# Patient Record
Sex: Female | Born: 1940 | Race: White | Hispanic: No | Smoking: Never smoker
Health system: Southern US, Community
[De-identification: ages and names within clinical notes are randomized; demographics above are authoritative.]

## PROBLEM LIST (undated history)

## (undated) DIAGNOSIS — C801 Malignant (primary) neoplasm, unspecified: Secondary | ICD-10-CM

## (undated) DIAGNOSIS — Z9889 Other specified postprocedural states: Secondary | ICD-10-CM

## (undated) DIAGNOSIS — M6281 Muscle weakness (generalized): Secondary | ICD-10-CM

## (undated) DIAGNOSIS — G8929 Other chronic pain: Secondary | ICD-10-CM

## (undated) DIAGNOSIS — M549 Dorsalgia, unspecified: Secondary | ICD-10-CM

## (undated) DIAGNOSIS — R112 Nausea with vomiting, unspecified: Secondary | ICD-10-CM

## (undated) DIAGNOSIS — G9341 Metabolic encephalopathy: Secondary | ICD-10-CM

## (undated) DIAGNOSIS — N390 Urinary tract infection, site not specified: Secondary | ICD-10-CM

## (undated) DIAGNOSIS — F419 Anxiety disorder, unspecified: Secondary | ICD-10-CM

## (undated) DIAGNOSIS — N209 Urinary calculus, unspecified: Secondary | ICD-10-CM

## (undated) DIAGNOSIS — F32A Depression, unspecified: Secondary | ICD-10-CM

## (undated) DIAGNOSIS — F329 Major depressive disorder, single episode, unspecified: Secondary | ICD-10-CM

## (undated) DIAGNOSIS — I10 Essential (primary) hypertension: Secondary | ICD-10-CM

## (undated) DIAGNOSIS — G4730 Sleep apnea, unspecified: Secondary | ICD-10-CM

## (undated) DIAGNOSIS — E785 Hyperlipidemia, unspecified: Secondary | ICD-10-CM

## (undated) DIAGNOSIS — N189 Chronic kidney disease, unspecified: Secondary | ICD-10-CM

## (undated) DIAGNOSIS — G2581 Restless legs syndrome: Secondary | ICD-10-CM

## (undated) DIAGNOSIS — H409 Unspecified glaucoma: Secondary | ICD-10-CM

## (undated) DIAGNOSIS — I209 Angina pectoris, unspecified: Secondary | ICD-10-CM

## (undated) DIAGNOSIS — M1990 Unspecified osteoarthritis, unspecified site: Secondary | ICD-10-CM

## (undated) DIAGNOSIS — J189 Pneumonia, unspecified organism: Secondary | ICD-10-CM

## (undated) DIAGNOSIS — K5900 Constipation, unspecified: Secondary | ICD-10-CM

## (undated) DIAGNOSIS — F319 Bipolar disorder, unspecified: Secondary | ICD-10-CM

## (undated) DIAGNOSIS — H9190 Unspecified hearing loss, unspecified ear: Secondary | ICD-10-CM

## (undated) DIAGNOSIS — Z972 Presence of dental prosthetic device (complete) (partial): Secondary | ICD-10-CM

## (undated) DIAGNOSIS — D759 Disease of blood and blood-forming organs, unspecified: Secondary | ICD-10-CM

## (undated) DIAGNOSIS — R569 Unspecified convulsions: Secondary | ICD-10-CM

## (undated) HISTORY — PX: CARPAL TUNNEL RELEASE: SHX101

## (undated) HISTORY — PX: BACK SURGERY: SHX140

## (undated) HISTORY — PX: CYSTOSCOPY W/ URETEROSCOPY W/ LITHOTRIPSY: SUR380

## (undated) HISTORY — PX: KNEE ARTHROSCOPY: SUR90

## (undated) HISTORY — DX: Unspecified glaucoma: H40.9

---

## 1973-02-15 HISTORY — PX: CHOLECYSTECTOMY: SHX55

## 1985-02-15 HISTORY — PX: ABDOMINAL HYSTERECTOMY: SHX81

## 1986-02-15 HISTORY — PX: UVULOPALATOPLASTY: SHX2633

## 1994-02-15 HISTORY — PX: OVARY SURGERY: SHX727

## 1994-02-15 HISTORY — PX: PORTACATH PLACEMENT: SHX2246

## 1997-02-15 HISTORY — PX: LAMINECTOMY LUMBAR SPLINE W/ PLACEMENT SPINAL CORD STIMULATOR: SHX1916

## 2001-02-15 HISTORY — PX: PORT-A-CATH REMOVAL: SHX5289

## 2002-02-15 DIAGNOSIS — I209 Angina pectoris, unspecified: Secondary | ICD-10-CM

## 2002-02-15 HISTORY — PX: FILTERING PROCEDURE: SHX5296

## 2002-02-15 HISTORY — DX: Angina pectoris, unspecified: I20.9

## 2003-02-16 HISTORY — PX: JOINT REPLACEMENT: SHX530

## 2011-08-03 ENCOUNTER — Encounter (HOSPITAL_COMMUNITY): Payer: Self-pay | Admitting: Respiratory Therapy

## 2011-08-06 ENCOUNTER — Encounter (HOSPITAL_COMMUNITY): Payer: Self-pay

## 2011-08-06 ENCOUNTER — Encounter (HOSPITAL_COMMUNITY)
Admission: RE | Admit: 2011-08-06 | Discharge: 2011-08-06 | Disposition: A | Discharge status: Home / Self Care | Payer: Medicare Other | Source: Ambulatory Visit | Attending: Anesthesiology | Admitting: Anesthesiology

## 2011-08-06 ENCOUNTER — Encounter (HOSPITAL_COMMUNITY)
Admission: RE | Admit: 2011-08-06 | Discharge: 2011-08-06 | Disposition: A | Discharge status: Home / Self Care | Payer: Medicare Other | Source: Ambulatory Visit | Attending: Orthopedic Surgery | Admitting: Orthopedic Surgery

## 2011-08-06 HISTORY — DX: Unspecified osteoarthritis, unspecified site: M19.90

## 2011-08-06 HISTORY — DX: Malignant (primary) neoplasm, unspecified: C80.1

## 2011-08-06 HISTORY — DX: Major depressive disorder, single episode, unspecified: F32.9

## 2011-08-06 HISTORY — DX: Unspecified convulsions: R56.9

## 2011-08-06 HISTORY — DX: Depression, unspecified: F32.A

## 2011-08-06 HISTORY — DX: Essential (primary) hypertension: I10

## 2011-08-06 HISTORY — DX: Other specified postprocedural states: Z98.890

## 2011-08-06 HISTORY — DX: Pneumonia, unspecified organism: J18.9

## 2011-08-06 HISTORY — DX: Sleep apnea, unspecified: G47.30

## 2011-08-06 HISTORY — DX: Urinary calculus, unspecified: N20.9

## 2011-08-06 HISTORY — DX: Chronic kidney disease, unspecified: N18.9

## 2011-08-06 HISTORY — DX: Angina pectoris, unspecified: I20.9

## 2011-08-06 HISTORY — DX: Nausea with vomiting, unspecified: R11.2

## 2011-08-06 HISTORY — DX: Disease of blood and blood-forming organs, unspecified: D75.9

## 2011-08-06 LAB — CBC
Platelets: 184 10*3/uL (ref 150–400)
RBC: 4 MIL/uL (ref 3.87–5.11)
WBC: 7.6 10*3/uL (ref 4.0–10.5)

## 2011-08-06 LAB — SURGICAL PCR SCREEN: Staphylococcus aureus: NEGATIVE

## 2011-08-06 LAB — APTT: aPTT: 40 seconds — ABNORMAL HIGH (ref 24–37)

## 2011-08-06 LAB — BASIC METABOLIC PANEL
Calcium: 9.1 mg/dL (ref 8.4–10.5)
GFR calc non Af Amer: 56 mL/min — ABNORMAL LOW (ref >90)
Sodium: 139 mEq/L (ref 135–145)

## 2011-08-06 LAB — PROTIME-INR: INR: 2.18 — ABNORMAL HIGH (ref 0.00–1.49)

## 2011-08-06 MED ORDER — CHLORHEXIDINE GLUCONATE 4 % EX LIQD: 60.0000 mL | Freq: Once | CUTANEOUS | Status: DC

## 2011-08-06 NOTE — Pre-Procedure Instructions (Addendum)
20 Evelyn Washington  08/06/2011   Your procedure is scheduled on: 6.28.13  Report to [facility] at 530 AM.  Call this number if you have problems the morning of surgery: [phone]   Remember:   Do not eat food or drink:After Midnight.    Take these medicines the morning of surgery with A SIP OF WATER:, eye drops, duragesic patch, gabapentin, robaxin, risperidone STOP coumadin now or per dr   Drucilla Schmidt not wear jewelry, make-up or nail polish.  Do not wear lotions, powders, or perfumes. You may wear deodorant.  Do not shave 48 hours prior to surgery. Men may shave face and neck.  Do not bring valuables to the hospital.  Contacts, dentures or bridgework may not be worn into surgery.  Leave suitcase in the car. After surgery it may be brought to your room.  For patients admitted to the hospital, checkout time is 11:00 AM the day of discharge.   Patients discharged the day of surgery will not be allowed to drive home.  Name and phone number of your driver: Evelyn Washington [phone]  Special Instructions: Incentive Spirometry - Practice and bring it with you on the day of surgery. chg shower nite before and morning of surgery do not use on face head or private area   Please read over the following fact sheets that you were given: Pain Booklet, Coughing and Deep Breathing, MRSA Information and Surgical Site Infection Prevention

## 2011-08-06 NOTE — Progress Notes (Signed)
Chart given to anesthesia pa to review

## 2011-08-09 ENCOUNTER — Encounter (HOSPITAL_COMMUNITY): Payer: Self-pay | Admitting: Vascular Surgery

## 2011-08-09 NOTE — Consult Note (Addendum)
Anesthesia Chart Review:  Patient is a 71 year old female scheduled for left total shoulder arthroplasty versus reverse total shoulder on 08/13/11 by Dr. Ranell Patrick.  History includes non-smoker, post-operative N/V, HTN, depression, OSA (previously followed by Dr. Maisie Fus at [facility] in [place]), glaucoma, PNA '11, DM2, CKD with history of ARF '04 s/p recovery, seizure reportedly related to Zofran, ovarian cancer, hypercoagulable state (PCP notes indicate possible prior + lupus anticoagulant when in [facility]) with history of DVT s/p IVC filter, deafness in left ear, multiple back surgeries, implantable spinal cord stimulator '99, right TKR, hysterectomy, uvulectomy, cholecystectomy.  She reportedly had a negative cardiac work-up in [place] in 2000.  PCP is Dr. [name] at [facility].  He medically cleared her pending no significant lab/EKG abnormalities.  (I routed her pre-operative results to him via Epic.)  He did recommend a Lovenox or Heparin bridge.  Natasha Mead at [facility] reports that Dr. Anise Salvo office is coordinating this.    EKG from 09/05/11 showed SR with occasional PVC.  CXR from 09/05/11 showed: 1. No radiographic evidence of acute cardiopulmonary disease. 2. Atherosclerosis. 3. Infusion pump or spinal cord stimulator projecting over the thoracic spine.  Labs noted.  Cr 0.99, non-fasting glucose 141.  PT/PTT are elevated.  She is scheduled to stop her Coumadin on 08/08/11.  She will need PT/PTT and T&S on arrival.  Shonna Chock, PA-C

## 2011-08-10 NOTE — H&P (Signed)
CC: left shoulder pain HPI: 71 y/o female with worsening left shoulder pain due to endstage osteoarthritis. Pt has elected to have total shoulder arthroplasty to decrease pain and increase function PMH: anxiety, anemia, hx of PE, chronic pain, cancer, depression, diabetes, hypertension, hyperlipidemia, kidney stones, seizures, skin cancer, sleep apnea Allergies: talwin, haldol, oxycontin, zofran Meds: see above ROS: shortness of breath with exertion, pain with rom left shoulder, uses walker for ambulation PE: 108/60  72  16  alert and appropriate 71 y/o female in no acute distress Cervical: good rom without pain, cranial nerves 2-12 intact Left shoulder: strength 3.5/5 nv intact distally Mild pedal edema distally Antalgic gait X-rays: end stage osteoarthritis left shoulder Assessment: left shoulder pain secondary to end stage osteoarthritis Plan: plan for total shoulder vs reverse total for pain relief and to increase function

## 2011-08-12 MED ORDER — SODIUM CHLORIDE 0.9 % IV SOLN: INTRAVENOUS | Status: DC

## 2011-08-12 MED ORDER — CEFAZOLIN SODIUM-DEXTROSE 2-3 GM-% IV SOLR
2.0000 g | INTRAVENOUS | Status: AC
  Administered 2011-08-13: 3 g via INTRAVENOUS
  Filled 2011-08-12: qty 50

## 2011-08-13 ENCOUNTER — Encounter (HOSPITAL_COMMUNITY)
Admission: RE | Disposition: A | Discharge status: Skilled Nursing Facility | Payer: Self-pay | Source: Ambulatory Visit | Attending: Orthopedic Surgery

## 2011-08-13 ENCOUNTER — Inpatient Hospital Stay (HOSPITAL_COMMUNITY): Payer: Medicare Other

## 2011-08-13 ENCOUNTER — Ambulatory Visit (HOSPITAL_COMMUNITY): Payer: Medicare Other | Admitting: Vascular Surgery

## 2011-08-13 ENCOUNTER — Encounter (HOSPITAL_COMMUNITY): Payer: Self-pay | Admitting: *Deleted

## 2011-08-13 ENCOUNTER — Encounter (HOSPITAL_COMMUNITY): Payer: Self-pay | Admitting: Vascular Surgery

## 2011-08-13 ENCOUNTER — Inpatient Hospital Stay (HOSPITAL_COMMUNITY)
Admission: RE | Admit: 2011-08-13 | Discharge: 2011-08-16 | DRG: 483 | Disposition: A | Discharge status: Skilled Nursing Facility | Payer: Medicare Other | Source: Ambulatory Visit | Attending: Orthopedic Surgery | Admitting: Orthopedic Surgery

## 2011-08-13 DIAGNOSIS — F411 Generalized anxiety disorder: Secondary | ICD-10-CM | POA: Diagnosis present

## 2011-08-13 DIAGNOSIS — F329 Major depressive disorder, single episode, unspecified: Secondary | ICD-10-CM | POA: Diagnosis present

## 2011-08-13 DIAGNOSIS — J189 Pneumonia, unspecified organism: Secondary | ICD-10-CM | POA: Diagnosis not present

## 2011-08-13 DIAGNOSIS — E118 Type 2 diabetes mellitus with unspecified complications: Secondary | ICD-10-CM

## 2011-08-13 DIAGNOSIS — R079 Chest pain, unspecified: Secondary | ICD-10-CM

## 2011-08-13 DIAGNOSIS — D649 Anemia, unspecified: Secondary | ICD-10-CM | POA: Diagnosis present

## 2011-08-13 DIAGNOSIS — M19019 Primary osteoarthritis, unspecified shoulder: Principal | ICD-10-CM | POA: Diagnosis present

## 2011-08-13 DIAGNOSIS — Z86718 Personal history of other venous thrombosis and embolism: Secondary | ICD-10-CM

## 2011-08-13 DIAGNOSIS — Z01812 Encounter for preprocedural laboratory examination: Secondary | ICD-10-CM

## 2011-08-13 DIAGNOSIS — I10 Essential (primary) hypertension: Secondary | ICD-10-CM

## 2011-08-13 DIAGNOSIS — D72829 Elevated white blood cell count, unspecified: Secondary | ICD-10-CM | POA: Diagnosis present

## 2011-08-13 DIAGNOSIS — G8929 Other chronic pain: Secondary | ICD-10-CM | POA: Diagnosis present

## 2011-08-13 DIAGNOSIS — K5900 Constipation, unspecified: Secondary | ICD-10-CM | POA: Diagnosis present

## 2011-08-13 DIAGNOSIS — F3289 Other specified depressive episodes: Secondary | ICD-10-CM | POA: Diagnosis present

## 2011-08-13 DIAGNOSIS — E785 Hyperlipidemia, unspecified: Secondary | ICD-10-CM | POA: Diagnosis present

## 2011-08-13 DIAGNOSIS — E119 Type 2 diabetes mellitus without complications: Secondary | ICD-10-CM

## 2011-08-13 DIAGNOSIS — R32 Unspecified urinary incontinence: Secondary | ICD-10-CM | POA: Diagnosis present

## 2011-08-13 DIAGNOSIS — E1165 Type 2 diabetes mellitus with hyperglycemia: Secondary | ICD-10-CM

## 2011-08-13 HISTORY — PX: TOTAL SHOULDER ARTHROPLASTY: SHX126

## 2011-08-13 LAB — BASIC METABOLIC PANEL WITH GFR
BUN: 13 mg/dL (ref 6–23)
CO2: 23 meq/L (ref 19–32)
Calcium: 8.8 mg/dL (ref 8.4–10.5)
Chloride: 100 meq/L (ref 96–112)
Creatinine, Ser: 0.91 mg/dL (ref 0.50–1.10)
GFR calc Af Amer: 72 mL/min — ABNORMAL LOW
GFR calc non Af Amer: 62 mL/min — ABNORMAL LOW
Glucose, Bld: 224 mg/dL — ABNORMAL HIGH (ref 70–99)
Potassium: 4.5 meq/L (ref 3.5–5.1)
Sodium: 135 meq/L (ref 135–145)

## 2011-08-13 LAB — CBC
HCT: 36.6 % (ref 36.0–46.0)
Hemoglobin: 11.7 g/dL — ABNORMAL LOW (ref 12.0–15.0)
MCH: 29.5 pg (ref 26.0–34.0)
MCHC: 32 g/dL (ref 30.0–36.0)
MCV: 92.2 fL (ref 78.0–100.0)
Platelets: 207 K/uL (ref 150–400)
RBC: 3.97 MIL/uL (ref 3.87–5.11)
RDW: 14.2 % (ref 11.5–15.5)
WBC: 13.7 K/uL — ABNORMAL HIGH (ref 4.0–10.5)

## 2011-08-13 LAB — GLUCOSE, CAPILLARY
Glucose-Capillary: 78 mg/dL (ref 70–99)
Glucose-Capillary: 84 mg/dL (ref 70–99)

## 2011-08-13 LAB — CARDIAC PANEL(CRET KIN+CKTOT+MB+TROPI)
CK, MB: 13.9 ng/mL (ref 0.3–4.0)
Troponin I: <0.3 ng/mL (ref <0.30)

## 2011-08-13 LAB — TYPE AND SCREEN

## 2011-08-13 SURGERY — ARTHROPLASTY, SHOULDER, TOTAL
Anesthesia: Regional | Site: Shoulder | Laterality: Left | Wound class: Clean

## 2011-08-13 MED ORDER — LEVOFLOXACIN IN D5W 750 MG/150ML IV SOLN
750.0000 mg | INTRAVENOUS | Status: DC
  Administered 2011-08-13 – 2011-08-15 (×3): 750 mg via INTRAVENOUS
  Filled 2011-08-13 (×5): qty 150

## 2011-08-13 MED ORDER — HYDROMORPHONE HCL PF 1 MG/ML IJ SOLN
0.2500 mg | INTRAMUSCULAR | Status: DC | PRN
  Administered 2011-08-13 (×2): 0.5 mg via INTRAVENOUS

## 2011-08-13 MED ORDER — CEFAZOLIN SODIUM 1-5 GM-% IV SOLN
INTRAVENOUS | Status: AC
  Filled 2011-08-13: qty 50

## 2011-08-13 MED ORDER — VITAMIN D3 25 MCG (1000 UNIT) PO TABS
1000.0000 [IU] | ORAL_TABLET | Freq: Every day | ORAL | Status: DC
  Administered 2011-08-14 – 2011-08-16 (×3): 1000 [IU] via ORAL
  Filled 2011-08-13 (×4): qty 1

## 2011-08-13 MED ORDER — CITALOPRAM HYDROBROMIDE 40 MG PO TABS
40.0000 mg | ORAL_TABLET | Freq: Every day | ORAL | Status: DC
  Filled 2011-08-13: qty 1

## 2011-08-13 MED ORDER — THROMBIN 5000 UNITS EX SOLR
CUTANEOUS | Status: DC | PRN
  Administered 2011-08-13: 5000 [IU] via TOPICAL

## 2011-08-13 MED ORDER — WARFARIN - PHYSICIAN DOSING INPATIENT
Freq: Every day | Status: DC
  Administered 2011-08-15: 17:00:00

## 2011-08-13 MED ORDER — SIMVASTATIN 40 MG PO TABS
40.0000 mg | ORAL_TABLET | Freq: Every evening | ORAL | Status: DC
  Administered 2011-08-14 – 2011-08-16 (×4): 40 mg via ORAL
  Filled 2011-08-13 (×4): qty 1

## 2011-08-13 MED ORDER — FENTANYL CITRATE 0.05 MG/ML IJ SOLN
INTRAMUSCULAR | Status: DC | PRN
  Administered 2011-08-13: 100 ug via INTRAVENOUS
  Administered 2011-08-13: 50 ug via INTRAVENOUS

## 2011-08-13 MED ORDER — VITAMIN C 500 MG PO TABS
500.0000 mg | ORAL_TABLET | Freq: Two times a day (BID) | ORAL | Status: DC
  Administered 2011-08-13 – 2011-08-16 (×6): 500 mg via ORAL
  Filled 2011-08-13 (×8): qty 1

## 2011-08-13 MED ORDER — GLUCOSE 40 % PO GEL
ORAL | Status: AC
  Administered 2011-08-13: 18.7 g via ORAL
  Filled 2011-08-13: qty 1

## 2011-08-13 MED ORDER — METOCLOPRAMIDE HCL 5 MG/ML IJ SOLN: 5.0000 mg | Freq: Three times a day (TID) | INTRAMUSCULAR | Status: DC | PRN

## 2011-08-13 MED ORDER — ONDANSETRON HCL 4 MG PO TABS: 4.0000 mg | ORAL_TABLET | Freq: Four times a day (QID) | ORAL | Status: DC | PRN

## 2011-08-13 MED ORDER — VITAMIN B-12 100 MCG PO TABS
100.0000 ug | ORAL_TABLET | Freq: Every day | ORAL | Status: DC
  Administered 2011-08-14 – 2011-08-16 (×3): 100 ug via ORAL
  Filled 2011-08-13 (×4): qty 1

## 2011-08-13 MED ORDER — PANTOPRAZOLE SODIUM 40 MG IV SOLR
40.0000 mg | Freq: Two times a day (BID) | INTRAVENOUS | Status: DC
  Administered 2011-08-13 – 2011-08-15 (×5): 40 mg via INTRAVENOUS
  Filled 2011-08-13 (×7): qty 40

## 2011-08-13 MED ORDER — ACETAMINOPHEN 325 MG PO TABS: 650.0000 mg | ORAL_TABLET | Freq: Four times a day (QID) | ORAL | Status: DC | PRN

## 2011-08-13 MED ORDER — SODIUM CHLORIDE 0.9 % IV SOLN
INTRAVENOUS | Status: DC
  Administered 2011-08-13: 11:00:00 via INTRAVENOUS

## 2011-08-13 MED ORDER — METHOCARBAMOL 750 MG PO TABS: 750.0000 mg | ORAL_TABLET | Freq: Three times a day (TID) | ORAL | Status: DC

## 2011-08-13 MED ORDER — THROMBIN 5000 UNITS EX SOLR
CUTANEOUS | Status: AC
  Filled 2011-08-13: qty 5000

## 2011-08-13 MED ORDER — PHENOL 1.4 % MT LIQD: 1.0000 | OROMUCOSAL | Status: DC | PRN

## 2011-08-13 MED ORDER — PROPOFOL 10 MG/ML IV EMUL
INTRAVENOUS | Status: DC | PRN
  Administered 2011-08-13: 17 mg via INTRAVENOUS

## 2011-08-13 MED ORDER — MENTHOL 3 MG MT LOZG: 1.0000 | LOZENGE | OROMUCOSAL | Status: DC | PRN

## 2011-08-13 MED ORDER — PHENYLEPHRINE HCL 10 MG/ML IJ SOLN
INTRAMUSCULAR | Status: DC | PRN
  Administered 2011-08-13: 120 ug via INTRAVENOUS
  Administered 2011-08-13 (×2): 80 ug via INTRAVENOUS

## 2011-08-13 MED ORDER — WARFARIN SODIUM 5 MG PO TABS
5.0000 mg | ORAL_TABLET | ORAL | Status: DC
  Administered 2011-08-13 – 2011-08-15 (×3): 5 mg via ORAL
  Filled 2011-08-13 (×3): qty 1

## 2011-08-13 MED ORDER — METOCLOPRAMIDE HCL 5 MG/ML IJ SOLN: 10.0000 mg | Freq: Once | INTRAMUSCULAR | Status: DC | PRN

## 2011-08-13 MED ORDER — NITROGLYCERIN 0.4 MG SL SUBL: 0.4000 mg | SUBLINGUAL_TABLET | SUBLINGUAL | Status: DC | PRN

## 2011-08-13 MED ORDER — POTASSIUM 99 MG PO TABS: 99.0000 mg | ORAL_TABLET | Freq: Every day | ORAL | Status: DC

## 2011-08-13 MED ORDER — GABAPENTIN 300 MG PO CAPS
300.0000 mg | ORAL_CAPSULE | Freq: Three times a day (TID) | ORAL | Status: DC
  Administered 2011-08-13 – 2011-08-16 (×11): 300 mg via ORAL
  Filled 2011-08-13 (×15): qty 1

## 2011-08-13 MED ORDER — GLUCOSE 40 % PO GEL
1.0000 | Freq: Once | ORAL | Status: AC
  Administered 2011-08-13: 18.7 g via ORAL

## 2011-08-13 MED ORDER — LISINOPRIL 20 MG PO TABS
20.0000 mg | ORAL_TABLET | Freq: Every day | ORAL | Status: DC
  Administered 2011-08-13: 20 mg via ORAL
  Filled 2011-08-13: qty 1

## 2011-08-13 MED ORDER — NEOSTIGMINE METHYLSULFATE 1 MG/ML IJ SOLN
INTRAMUSCULAR | Status: DC | PRN
  Administered 2011-08-13: 3 mg via INTRAVENOUS

## 2011-08-13 MED ORDER — PHENYLEPHRINE HCL 10 MG/ML IJ SOLN
10.0000 mg | INTRAVENOUS | Status: DC | PRN
  Administered 2011-08-13: 20 ug/min via INTRAVENOUS

## 2011-08-13 MED ORDER — HYDROMORPHONE HCL PF 1 MG/ML IJ SOLN
INTRAMUSCULAR | Status: AC
  Filled 2011-08-13: qty 1

## 2011-08-13 MED ORDER — INSULIN ASPART 100 UNIT/ML ~~LOC~~ SOLN
0.0000 [IU] | Freq: Three times a day (TID) | SUBCUTANEOUS | Status: DC
  Administered 2011-08-14: 2 [IU] via SUBCUTANEOUS
  Administered 2011-08-14: 3 [IU] via SUBCUTANEOUS
  Administered 2011-08-14 – 2011-08-15 (×2): 5 [IU] via SUBCUTANEOUS
  Administered 2011-08-15 – 2011-08-16 (×4): 3 [IU] via SUBCUTANEOUS

## 2011-08-13 MED ORDER — SODIUM CHLORIDE 0.9 % IR SOLN
Status: DC | PRN
  Administered 2011-08-13: 1000 mL

## 2011-08-13 MED ORDER — CITALOPRAM HYDROBROMIDE 20 MG PO TABS: 20.0000 mg | ORAL_TABLET | Freq: Every day | ORAL | Status: DC

## 2011-08-13 MED ORDER — HEMOSTATIC AGENTS (NO CHARGE) OPTIME
TOPICAL | Status: DC | PRN
  Administered 2011-08-13: 1 via TOPICAL

## 2011-08-13 MED ORDER — AMITRIPTYLINE HCL 50 MG PO TABS
50.0000 mg | ORAL_TABLET | Freq: Every day | ORAL | Status: DC
  Administered 2011-08-13 – 2011-08-15 (×3): 50 mg via ORAL
  Filled 2011-08-13 (×5): qty 1

## 2011-08-13 MED ORDER — CITALOPRAM HYDROBROMIDE 20 MG PO TABS
20.0000 mg | ORAL_TABLET | Freq: Every day | ORAL | Status: DC
  Administered 2011-08-13 – 2011-08-15 (×3): 20 mg via ORAL
  Filled 2011-08-13 (×4): qty 1

## 2011-08-13 MED ORDER — LACTATED RINGERS IV SOLN
INTRAVENOUS | Status: DC | PRN
  Administered 2011-08-13 (×2): via INTRAVENOUS

## 2011-08-13 MED ORDER — SCOPOLAMINE 1 MG/3DAYS TD PT72
MEDICATED_PATCH | TRANSDERMAL | Status: DC | PRN
  Administered 2011-08-13: 1 via TRANSDERMAL

## 2011-08-13 MED ORDER — ONDANSETRON HCL 4 MG/2ML IJ SOLN
4.0000 mg | Freq: Four times a day (QID) | INTRAMUSCULAR | Status: DC | PRN
  Administered 2011-08-13: 4 mg via INTRAVENOUS
  Filled 2011-08-13: qty 2

## 2011-08-13 MED ORDER — GABAPENTIN 600 MG PO TABS
600.0000 mg | ORAL_TABLET | Freq: Three times a day (TID) | ORAL | Status: DC
Start: 2011-08-13 — End: 2011-08-13
  Administered 2011-08-13: 600 mg via ORAL
  Filled 2011-08-13 (×4): qty 1

## 2011-08-13 MED ORDER — DORZOLAMIDE HCL 2 % OP SOLN
1.0000 [drp] | Freq: Three times a day (TID) | OPHTHALMIC | Status: DC
  Administered 2011-08-14 – 2011-08-16 (×9): 1 [drp] via OPHTHALMIC
  Filled 2011-08-13: qty 10

## 2011-08-13 MED ORDER — BUPIVACAINE-EPINEPHRINE PF 0.25-1:200000 % IJ SOLN
INTRAMUSCULAR | Status: AC
  Filled 2011-08-13: qty 30

## 2011-08-13 MED ORDER — ROCURONIUM BROMIDE 100 MG/10ML IV SOLN
INTRAVENOUS | Status: DC | PRN
  Administered 2011-08-13: 50 mg via INTRAVENOUS

## 2011-08-13 MED ORDER — ENOXAPARIN SODIUM 30 MG/0.3ML ~~LOC~~ SOLN
30.0000 mg | Freq: Two times a day (BID) | SUBCUTANEOUS | Status: DC
  Administered 2011-08-14 – 2011-08-16 (×5): 30 mg via SUBCUTANEOUS
  Filled 2011-08-13 (×7): qty 0.3

## 2011-08-13 MED ORDER — METFORMIN HCL 500 MG PO TABS
1000.0000 mg | ORAL_TABLET | Freq: Two times a day (BID) | ORAL | Status: DC
  Filled 2011-08-13 (×2): qty 2

## 2011-08-13 MED ORDER — CEFAZOLIN SODIUM-DEXTROSE 2-3 GM-% IV SOLR
2.0000 g | Freq: Four times a day (QID) | INTRAVENOUS | Status: AC
  Administered 2011-08-13 – 2011-08-14 (×3): 2 g via INTRAVENOUS
  Filled 2011-08-13 (×4): qty 50

## 2011-08-13 MED ORDER — MIDAZOLAM HCL 5 MG/5ML IJ SOLN
INTRAMUSCULAR | Status: DC | PRN
  Administered 2011-08-13: 2 mg via INTRAVENOUS

## 2011-08-13 MED ORDER — MORPHINE SULFATE 4 MG/ML IJ SOLN
4.0000 mg | INTRAMUSCULAR | Status: DC | PRN
  Administered 2011-08-13 – 2011-08-15 (×7): 4 mg via INTRAVENOUS
  Filled 2011-08-13 (×7): qty 1

## 2011-08-13 MED ORDER — METHOCARBAMOL 100 MG/ML IJ SOLN
500.0000 mg | INTRAMUSCULAR | Status: AC
  Administered 2011-08-13: 500 mg via INTRAVENOUS
  Filled 2011-08-13: qty 5

## 2011-08-13 MED ORDER — METOCLOPRAMIDE HCL 10 MG PO TABS: 5.0000 mg | ORAL_TABLET | Freq: Three times a day (TID) | ORAL | Status: DC | PRN

## 2011-08-13 MED ORDER — RISPERIDONE 1 MG PO TABS
1.0000 mg | ORAL_TABLET | Freq: Every day | ORAL | Status: DC
  Administered 2011-08-14 – 2011-08-16 (×3): 1 mg via ORAL
  Filled 2011-08-13 (×5): qty 1

## 2011-08-13 MED ORDER — OXYCODONE-ACETAMINOPHEN 5-325 MG PO TABS
1.0000 | ORAL_TABLET | ORAL | Status: DC | PRN
  Administered 2011-08-14 – 2011-08-16 (×9): 2 via ORAL
  Filled 2011-08-13 (×9): qty 2

## 2011-08-13 MED ORDER — FENTANYL 50 MCG/HR TD PT72
50.0000 ug | MEDICATED_PATCH | TRANSDERMAL | Status: DC
  Administered 2011-08-16: 50 ug via TRANSDERMAL
  Filled 2011-08-13: qty 1

## 2011-08-13 MED ORDER — FUROSEMIDE 20 MG PO TABS
20.0000 mg | ORAL_TABLET | Freq: Every day | ORAL | Status: DC
  Administered 2011-08-13 – 2011-08-15 (×3): 20 mg via ORAL
  Filled 2011-08-13 (×3): qty 1

## 2011-08-13 MED ORDER — INSULIN NPH (HUMAN) (ISOPHANE) 100 UNIT/ML ~~LOC~~ SUSP
55.0000 [IU] | Freq: Two times a day (BID) | SUBCUTANEOUS | Status: DC
  Filled 2011-08-13: qty 10

## 2011-08-13 MED ORDER — GABAPENTIN 600 MG PO TABS
300.0000 mg | ORAL_TABLET | Freq: Three times a day (TID) | ORAL | Status: DC
  Filled 2011-08-13 (×3): qty 0.5

## 2011-08-13 MED ORDER — WARFARIN SODIUM 7.5 MG PO TABS
7.5000 mg | ORAL_TABLET | ORAL | Status: DC
  Filled 2011-08-13: qty 1

## 2011-08-13 MED ORDER — ACETAMINOPHEN 650 MG RE SUPP: 650.0000 mg | Freq: Four times a day (QID) | RECTAL | Status: DC | PRN

## 2011-08-13 MED ORDER — HYDROCODONE-ACETAMINOPHEN 5-325 MG PO TABS
1.0000 | ORAL_TABLET | Freq: Four times a day (QID) | ORAL | Status: DC | PRN
  Administered 2011-08-14: 1 via ORAL
  Filled 2011-08-13: qty 1

## 2011-08-13 MED ORDER — METHOCARBAMOL 100 MG/ML IJ SOLN: 500.0000 mg | Freq: Four times a day (QID) | INTRAVENOUS | Status: DC | PRN

## 2011-08-13 MED ORDER — GLYCOPYRROLATE 0.2 MG/ML IJ SOLN
INTRAMUSCULAR | Status: DC | PRN
  Administered 2011-08-13: 0.4 mg via INTRAVENOUS

## 2011-08-13 MED ORDER — BUPIVACAINE-EPINEPHRINE PF 0.5-1:200000 % IJ SOLN
INTRAMUSCULAR | Status: DC | PRN
  Administered 2011-08-13: 30 mL

## 2011-08-13 MED ORDER — LIDOCAINE HCL (CARDIAC) 20 MG/ML IV SOLN
INTRAVENOUS | Status: DC | PRN
  Administered 2011-08-13: 70 mg via INTRAVENOUS

## 2011-08-13 MED ORDER — BUPIVACAINE-EPINEPHRINE 0.25% -1:200000 IJ SOLN
INTRAMUSCULAR | Status: DC | PRN
  Administered 2011-08-13: 8 mL

## 2011-08-13 MED ORDER — METHOCARBAMOL 500 MG PO TABS
500.0000 mg | ORAL_TABLET | Freq: Four times a day (QID) | ORAL | Status: DC | PRN
  Administered 2011-08-14 – 2011-08-16 (×6): 500 mg via ORAL
  Filled 2011-08-13 (×5): qty 1

## 2011-08-13 MED ORDER — MAGNESIUM OXIDE 400 MG PO TABS
400.0000 mg | ORAL_TABLET | Freq: Every day | ORAL | Status: DC
  Administered 2011-08-14 – 2011-08-16 (×3): 400 mg via ORAL
  Filled 2011-08-13 (×5): qty 1

## 2011-08-13 SURGICAL SUPPLY — 68 items
BLADE SAW SAG 73X25 THK (BLADE) ×1
BLADE SAW SGTL 73X25 THK (BLADE) ×1 IMPLANT
BUR SURG 4X8 MED (BURR) IMPLANT
BURR SURG 4X8 MED (BURR)
CEMENT BONE DEPUY (Cement) ×2 IMPLANT
CLOTH BEACON ORANGE TIMEOUT ST (SAFETY) ×2 IMPLANT
CLSR STERI-STRIP ANTIMIC 1/2X4 (GAUZE/BANDAGES/DRESSINGS) ×2 IMPLANT
COVER SURGICAL LIGHT HANDLE (MISCELLANEOUS) ×2 IMPLANT
DRAPE INCISE IOBAN 66X45 STRL (DRAPES) ×2 IMPLANT
DRAPE U-SHAPE 47X51 STRL (DRAPES) ×2 IMPLANT
DRAPE X-RAY CASS 24X20 (DRAPES) IMPLANT
DRILL BIT 5/64 (BIT) ×2 IMPLANT
DRSG ADAPTIC 3X8 NADH LF (GAUZE/BANDAGES/DRESSINGS) IMPLANT
DRSG PAD ABDOMINAL 8X10 ST (GAUZE/BANDAGES/DRESSINGS) IMPLANT
DURAPREP 26ML APPLICATOR (WOUND CARE) ×4 IMPLANT
ELECT BLADE 4.0 EZ CLEAN MEGAD (MISCELLANEOUS) ×2
ELECT NEEDLE TIP 2.8 STRL (NEEDLE) ×2 IMPLANT
ELECT REM PT RETURN 9FT ADLT (ELECTROSURGICAL) ×2
ELECTRODE BLDE 4.0 EZ CLN MEGD (MISCELLANEOUS) ×1 IMPLANT
ELECTRODE REM PT RTRN 9FT ADLT (ELECTROSURGICAL) ×1 IMPLANT
GLOVE BIOGEL PI ORTHO PRO 7.5 (GLOVE) ×1
GLOVE BIOGEL PI ORTHO PRO SZ8 (GLOVE) ×1
GLOVE EUDERMIC 7 POWDERFREE (GLOVE) ×2 IMPLANT
GLOVE ORTHO TXT STRL SZ7.5 (GLOVE) ×2 IMPLANT
GLOVE PI ORTHO PRO STRL 7.5 (GLOVE) ×1 IMPLANT
GLOVE PI ORTHO PRO STRL SZ8 (GLOVE) ×1 IMPLANT
GLOVE SURG ORTHO 8.0 STRL STRW (GLOVE) ×4 IMPLANT
GLOVE SURG SS PI 7.0 STRL IVOR (GLOVE) ×2 IMPLANT
GOWN STRL NON-REIN LRG LVL3 (GOWN DISPOSABLE) IMPLANT
GOWN STRL REIN XL XLG (GOWN DISPOSABLE) ×6 IMPLANT
HANDPIECE INTERPULSE COAX TIP (DISPOSABLE)
KIT BASIN OR (CUSTOM PROCEDURE TRAY) ×2 IMPLANT
KIT ROOM TURNOVER OR (KITS) ×2 IMPLANT
MANIFOLD NEPTUNE II (INSTRUMENTS) ×2 IMPLANT
NDL SUT 6 .5 CRC .975X.05 MAYO (NEEDLE) ×1 IMPLANT
NEEDLE 1/2 CIR MAYO (NEEDLE) IMPLANT
NEEDLE HYPO 25GX1X1/2 BEV (NEEDLE) ×2 IMPLANT
NEEDLE MAYO TAPER (NEEDLE) ×1
NS IRRIG 1000ML POUR BTL (IV SOLUTION) ×2 IMPLANT
PACK SHOULDER (CUSTOM PROCEDURE TRAY) ×2 IMPLANT
PAD ARMBOARD 7.5X6 YLW CONV (MISCELLANEOUS) ×4 IMPLANT
SET HNDPC FAN SPRY TIP SCT (DISPOSABLE) IMPLANT
SLING ARM FOAM STRAP XLG (SOFTGOODS) ×2 IMPLANT
SLING ARM IMMOBILIZER LRG (SOFTGOODS) IMPLANT
SLING ARM IMMOBILIZER MED (SOFTGOODS) IMPLANT
SMARTMIX MINI TOWER (MISCELLANEOUS)
SPONGE GAUZE 4X4 12PLY (GAUZE/BANDAGES/DRESSINGS) IMPLANT
SPONGE LAP 18X18 X RAY DECT (DISPOSABLE) ×2 IMPLANT
SPONGE LAP 4X18 X RAY DECT (DISPOSABLE) ×2 IMPLANT
SPONGE SURGIFOAM ABS GEL SZ50 (HEMOSTASIS) ×2 IMPLANT
STRIP CLOSURE SKIN 1/2X4 (GAUZE/BANDAGES/DRESSINGS) IMPLANT
SUCTION FRAZIER TIP 10 FR DISP (SUCTIONS) ×2 IMPLANT
SUT FIBERWIRE #2 38 T-5 BLUE (SUTURE) ×10
SUT MNCRL AB 4-0 PS2 18 (SUTURE) ×2 IMPLANT
SUT VIC AB 0 CT1 27 (SUTURE) ×1
SUT VIC AB 0 CT1 27XBRD ANBCTR (SUTURE) ×1 IMPLANT
SUT VIC AB 2-0 CT1 27 (SUTURE) ×1
SUT VIC AB 2-0 CT1 TAPERPNT 27 (SUTURE) ×1 IMPLANT
SUT VICRYL AB 2 0 TIES (SUTURE) IMPLANT
SUTURE FIBERWR #2 38 T-5 BLUE (SUTURE) ×5 IMPLANT
SYR CONTROL 10ML LL (SYRINGE) ×2 IMPLANT
TAPE CLOTH SURG 6X10 WHT LF (GAUZE/BANDAGES/DRESSINGS) ×2 IMPLANT
TOWEL OR 17X24 6PK STRL BLUE (TOWEL DISPOSABLE) ×2 IMPLANT
TOWEL OR 17X26 10 PK STRL BLUE (TOWEL DISPOSABLE) ×2 IMPLANT
TOWER SMARTMIX MINI (MISCELLANEOUS) IMPLANT
TRAY FOLEY CATH 14FR (SET/KITS/TRAYS/PACK) IMPLANT
WATER STERILE IRR 1000ML POUR (IV SOLUTION) ×2 IMPLANT
YANKAUER SUCT BULB TIP NO VENT (SUCTIONS) IMPLANT

## 2011-08-13 NOTE — Brief Op Note (Signed)
08/13/2011  10:29 AM  PATIENT:  Evelyn Washington  71 y.o. female  PRE-OPERATIVE DIAGNOSIS:  left shoulder OA, end stage  POST-OPERATIVE DIAGNOSIS:  left shoulder OA, end stage  PROCEDURE:  Procedure(s) (LRB): TOTAL SHOULDER ARTHROPLASTY (Left), [employer]  SURGEON:  Surgeon(s) and Role:    * Verlee Rossetti, MD - Primary  PHYSICIAN ASSISTANT:   ASSISTANTS: Thea Gist, PA-C   ANESTHESIA:   regional and general  EBL:  Total I/O In: 1000 [I.V.:1000] Out: 200 [Blood:200]  BLOOD ADMINISTERED:none  DRAINS: none   LOCAL MEDICATIONS USED:  MARCAINE     SPECIMEN:  No Specimen  DISPOSITION OF SPECIMEN:  N/A  COUNTS:  YES  TOURNIQUET:  * No tourniquets in log *  DICTATION: .Other Dictation: Dictation Number [id number]  PLAN OF CARE: Admit to inpatient   PATIENT DISPOSITION:  PACU - hemodynamically stable.   Delay start of Pharmacological VTE agent (>24hrs) due to surgical blood loss or risk of bleeding: not applicable

## 2011-08-13 NOTE — Consult Note (Addendum)
[facility] History and Physical  Parveen Freehling ION:[id number] DOB: [birth year] DOA: 08/13/2011   PCP: [name], MD   Reason for consultation: Chest pain.   HPI: This is very pleasant white woman with PMH significant for Hypertension, Diabetes, History of PE and DVT in 2004 on Coumadin, obesity who was admitted for elective left shoulder arthroplasty due to end stage OA. She stop taking coumadin on Wednesday. She has been using heparin 5000 units BID for last 2 days prior to admission. She denies any worsening lower extremities edema. She started to complain of chest pain after surgery. She notice pain started after  she had nerve block. She describe pain as stabbing, pressure like, middle of her chest. No associated with meals. Pleuritic nature, accompanied by mild shortness of breath. Chest pain was constant since this morning, has resolved now after morphine. Chest pain improved with cold liquids. Patient vomit once today, fluid content.   Review of Systems:  Constitutional:  No weight loss, night sweats, Fevers, chills, fatigue.  HEENT:  No headaches, Difficulty swallowing,Tooth/dental problems,Sore throat,  No sneezing, itching, ear ache, nasal congestion, post nasal drip,  Cardio-vascular:  No Orthopnea, PND, swelling in lower extremities, anasarca, dizziness, palpitations  GI:  No abdominal pain, nausea, diarrhea, change in bowel habits, loss of appetite  Resp:  No shortness of breath with exertion or at rest. No excess mucus, no productive cough, No non-productive cough, No coughing up of blood.No change in color of mucus.No wheezing.No chest wall deformity  Skin:  no rash or lesions.  GU:  no dysuria, change in color of urine, no urgency or frequency. No flank pain.   Psych:  No change in mood or affect. No depression or anxiety. No memory loss.    Past Medical History  Diagnosis Date  . PONV (postoperative nausea and vomiting)     occ  . Hypertension   . No  pertinent past medical history     echo, stress done [place]  . Depression   . Pneumonia     2011  . Sleep apnea     bipap  . Diabetes mellitus   . Stones in the urinary tract   . Chronic kidney disease     kidneys shut done 04 dehydrated, on oxycodone , cleared within 3 weeks no problems since  . Seizures     with zofran  . Cancer     ovarian   . Arthritis   . Blood dyscrasia     " clot too easy  "   . Anginal pain 04    chest pain , chest wall inflamm  . Glaucoma    Past Surgical History  Procedure Date  . Cholecystectomy 75  . Cesarean section 79  . Knee arthroscopy 82,83,84,,85lft,87 rt  . Joint replacement 05    rt  . Carpal tunnel release 84 rt ,lft  . Abdominal hysterectomy 87    back and hip tramua  . Uvulopalatoplasty 88  . Ovary surgery 96  . Portacath placement 96  . Port-a-cath removal 03  . Laminectomy lumbar spline w/ placement spinal cord stimulator 99    battery replaced x2  . Filtering procedure 04    greenfield filter- groin  . Cystoscopy w/ ureteroscopy w/ lithotripsy   . Back surgery     x4   Social History:  reports that she has never smoked. She does not have any smokeless tobacco history on file. She reports that she does not drink alcohol or  use illicit drugs.  Allergies  Allergen Reactions  . Talwin (Pentazocine) Other (See Comments)    halucinations  . Haloperidol And Related   . Oxycontin (Oxycodone Hcl Er) Other (See Comments)    "don't want because got hooked on it"  . Zofran (Ondansetron Hcl) Other (See Comments)    seizures    History reviewed. No pertinent family history.  Prior to Admission medications   Medication Sig Start Date End Date Taking? Authorizing Provider  amitriptyline (ELAVIL) 50 MG tablet Take 50 mg by mouth at bedtime.   Yes Historical Provider, MD  cholecalciferol (VITAMIN D) 1000 UNITS tablet Take 1,000 Units by mouth daily.   Yes Historical Provider, MD  citalopram (CELEXA) 40 MG tablet  Take 40 mg by mouth daily.   Yes Historical Provider, MD  Cyanocobalamin (VITAMIN B-12 SL) Place 1,200 mg under the tongue daily.   Yes Historical Provider, MD  dorzolamide (TRUSOPT) 2 % ophthalmic solution Place 1 drop into both eyes 3 (three) times daily.   Yes Historical Provider, MD  fentaNYL (DURAGESIC - DOSED MCG/HR) 50 MCG/HR Place 1 patch onto the skin every 3 (three) days.   Yes Historical Provider, MD  furosemide (LASIX) 20 MG tablet Take 20 mg by mouth daily.   Yes Historical Provider, MD  gabapentin (NEURONTIN) 600 MG tablet Take 600 mg by mouth 4 (four) times daily - after meals and at bedtime.   Yes Historical Provider, MD  HYDROcodone-acetaminophen (NORCO) 5-325 MG per tablet Take 1 tablet by mouth every 6 (six) hours as needed.   Yes Historical Provider, MD  Insulin Isophane Human (HUMULIN N [place]) Inject 55-60 Units into the skin 2 (two) times daily. Take 55 units in the morning and 60 units in the evening   Yes Historical Provider, MD  lisinopril (PRINIVIL,ZESTRIL) 20 MG tablet Take 20 mg by mouth daily.   Yes Historical Provider, MD  magnesium oxide (MAG-OX) 400 MG tablet Take 400 mg by mouth daily.   Yes Historical Provider, MD  metFORMIN (GLUCOPHAGE) 1000 MG tablet Take 1,000 mg by mouth 2 (two) times daily with a meal.   Yes Historical Provider, MD  methocarbamol (ROBAXIN) 750 MG tablet Take 750 mg by mouth 3 (three) times daily.   Yes Historical Provider, MD  Potassium 99 MG TABS Take 99 mg by mouth daily.   Yes Historical Provider, MD  risperiDONE (RISPERDAL) 1 MG tablet Take 1 mg by mouth daily.   Yes Historical Provider, MD  simvastatin (ZOCOR) 40 MG tablet Take 40 mg by mouth every evening.   Yes Historical Provider, MD  vitamin C (ASCORBIC ACID) 500 MG tablet Take 500 mg by mouth 2 (two) times daily.   Yes Historical Provider, MD  warfarin (COUMADIN) 5 MG tablet Take 5-7.5 mg by mouth daily. Take 7.5 mg on mondays and wednesdays. Take 5 mg the rest of the week.   Yes  Historical Provider, MD   Physical Exam: Filed Vitals:   08/13/11 1200 08/13/11 1215 08/13/11 1222 08/13/11 1733  BP: 115/55 125/55  103/47  Pulse: 80 80 79 89  Temp:  98.3 F (36.8 C)  100.3 F (37.9 C)  TempSrc:      Resp: 15 16 15 18   SpO2: 93% 94% 94% 99%   BP 103/47  Pulse 89  Temp 100.3 F (37.9 C) (Oral)  Resp 18  SpO2 99%  General Appearance:    Alert, cooperative, no distress, appears stated age  Head:    Normocephalic, without obvious abnormality,  atraumatic  Eyes:    PERRL, conjunctiva/corneas clear, EOM's intact,      Ears:    Normal TM's and external ear canals, both ears  Nose:   Nares normal, septum midline, mucosa normal, no drainage    or sinus tenderness  Throat:   Lips, mucosa, and tongue normal; teeth and gums normal  Neck:   Supple, symmetrical, trachea midline, no adenopathy;    thyroid:  no enlargement/tenderness/nodules; no carotid   bruit or JVD     Lungs:     Clear to auscultation bilaterally, respirations unlabored, decrease breath sounds.   Chest Wall:    No tenderness or deformity   Heart:    Regular rate and rhythm, S1 and S2 normal, no murmur, rub   or gallop     Abdomen:     Soft, non-tender, bowel sounds active all four quadrants,    no masses, no organomegaly        Extremities:   Extremities normal, atraumatic, no cyanosis or edema, SCDs on. Left shoulder with clean dressing.   Pulses:   2+ and symmetric all extremities  Skin:   Skin color, texture, turgor normal, no rashes or lesions  Lymph nodes:   Cervical, supraclavicular, and axillary nodes normal  Neurologic:   CNII-XII intact, normal strength, sensation and reflexes    throughout    Labs on Admission:  CBG:  Lab 08/13/11 1603 08/13/11 1102 08/13/11 0604 08/13/11 0602  GLUCAP 138* 115* 84 78    Radiological Exams on Admission: Dg Shoulder Left Port  08/13/2011  *RADIOLOGY REPORT*  Clinical Data: Post shoulder arthroplasty.  PORTABLE LEFT SHOULDER - 2+ VIEW  Comparison:  None  Findings: Changes of left shoulder arthroplasty.  Normal AP alignment.  No hardware or bony complicating feature.  IMPRESSION: Left shoulder replacement.  No complicating feature.  Original Report Authenticated By: Cyndie Chime, M.D.    EKG: No ST segment elevation or depression.   Assessment/Plan:  1-Chest pain: Patient notice that chest pain started after nerve block, pleuritic in nature, accompanied with SOB. EKG with no ST segment elevation or depression. She is not tachycardic. I will order Chest x ray to rule out Pneumothorax or others lungs process. I will cycle cardiac enzymes. If chest x ray negative could consider Ct angio.  2-Osteoarthrosis, Left shoulder region. Post surgery. Incentive spirometry at bedside.  3-Hypertension: I will hold lisinopril, SBP soft. Continue with lasix.  4-Diabetes: Patient not eating. She vomit today. I will discontinue humulin. I will start SSI. I will discontinue metformin in case patient required contrast.  5-Depression: I will decrease celexa to 20 mg. I will decrease gabapentin to 300 mg TID to avoid oversedation.  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   Thank you for consult will follow with you.    Time spend: 30 minutes.  Family Communication: No family at bedside. I  Discuss plan of care with patient.    Hartley Barefoot, MD  [facility] Pager [phone]  If 7PM-7AM, please contact night-coverage [email or web address] Password [facility] 08/13/2011, 5:52 PM   Chest x ray with asymmetric infiltrates, Patient with mild fever. I will start Levaquin to cover for PNA. Check CBC.

## 2011-08-13 NOTE — Progress Notes (Signed)
Patient is complaining post op of chest pain, more right sided.  Also some chest heaviness.  Given her health history, I am calling internal medicine for an eval.  Patient's vitals are stable and sats 94% on 2 liters nasal cannula.  Beverely Low, MD

## 2011-08-13 NOTE — Progress Notes (Signed)
CRITICAL VALUE ALERT  Critical value received:  CKMB 13.9  Date of notification:  08/13/11  Time of notification:  1934  Critical value read back:yes  Nurse who received alert:  Paralee Cancel, RN  MD notified (1st page):   Nurse Practitioner  Time of first page:  1950  MD notified (2nd page):  Time of second page:  Responding MD:  Nurse Practitioner  Time MD responded:  [phone]

## 2011-08-13 NOTE — Consult Note (Signed)
I will order CT angio rule out PE. Patient was getting low dose heparin. Her chest pain is pleuritic, Chest x ray negative for Pneumothorax, showed infiltrate but poor quality study. D dimer should be elevated post surgery. Will order B-met to make sure kidney function stable. CT will help define lung finding.

## 2011-08-13 NOTE — Anesthesia Procedure Notes (Addendum)
Procedure Name: Intubation Date/Time: 08/13/2011 7:58 AM Performed by: Quentin Ore Pre-anesthesia Checklist: Patient identified, Emergency Drugs available, Suction available, Patient being monitored and Timeout performed Patient Re-evaluated:Patient Re-evaluated prior to inductionOxygen Delivery Method: Circle system utilized Preoxygenation: Pre-oxygenation with 100% oxygen Intubation Type: IV induction Ventilation: Mask ventilation without difficulty and Oral airway inserted - appropriate to patient size Laryngoscope Size: Mac and 4 Grade View: Grade I Tube type: Oral Tube size: 7.5 mm Number of attempts: 1 Airway Equipment and Method: Stylet Placement Confirmation: ETT inserted through vocal cords under direct vision,  positive ETCO2 and breath sounds checked- equal and bilateral Secured at: 21 cm Tube secured with: Tape Dental Injury: Teeth and Oropharynx as per pre-operative assessment    Anesthesia Regional Block:  Interscalene brachial plexus block  Pre-Anesthetic Checklist: ,, timeout performed, Correct Patient, Correct Site, Correct Laterality, Correct Procedure, Correct Position, site marked, Risks and benefits discussed,  Surgical consent,  Pre-op evaluation,  At surgeon's request and post-op pain management  Laterality: Left  Prep: chloraprep       Needles:  Injection technique: Single-shot  Needle Type: Echogenic Stimulator Needle     Needle Length: 5cm 5 cm Needle Gauge: 22 and 22 G    Additional Needles:  Procedures: ultrasound guided and nerve stimulator Interscalene brachial plexus block  Nerve Stimulator or Paresthesia:  Response: biceps flexion, 0.45 mA,   Additional Responses:   Narrative:  Start time: 08/13/2011 7:10 AM End time: 08/13/2011 7:27 AM Injection made incrementally with aspirations every 5 mL.  Performed by: Personally  Anesthesiologist: Dr Chaney Malling  Additional Notes: Functioning IV was confirmed and monitors were applied.  A  50mm 22ga Arrow echogenic stimulator needle was used. Sterile prep and drape,hand hygiene and sterile gloves were used.  Negative aspiration and negative test dose prior to incremental administration of local anesthetic. The patient tolerated the procedure well.  Ultrasound guidance: relevent anatomy identified, needle position confirmed, local anesthetic spread visualized around nerve(s), vascular puncture avoided.  Image printed for medical record.   Interscalene brachial plexus block

## 2011-08-13 NOTE — Progress Notes (Signed)
Patient states she feels shaky.  States blood sugar at home was 67. Checked cbg on arrival and it was 63 then rechecked and it was 84.  Dr. Ivin Booty notified that she took 60 units humolog at 1800 and had dinner yesterday. Order obtained to give 1/2 of 37.5 gm tube of oral glucose gel.

## 2011-08-13 NOTE — Anesthesia Preprocedure Evaluation (Addendum)
Anesthesia Evaluation  Patient identified by MRN, date of birth, ID band Patient awake    Reviewed: Allergy & Precautions, H&P , NPO status , Patient's Chart, lab work & pertinent test results  History of Anesthesia Complications (+) PONV  Airway Mallampati: III  Neck ROM: full    Dental  (+) Edentulous Upper and Edentulous Lower   Pulmonary sleep apnea ,  breath sounds clear to auscultation        Cardiovascular hypertension, + angina     Neuro/Psych Depression    GI/Hepatic   Endo/Other  Diabetes mellitus-Morbid obesity  Renal/GU      Musculoskeletal  (+) Arthritis -,   Abdominal   Peds  Hematology   Anesthesia Other Findings   Reproductive/Obstetrics                          Anesthesia Physical Anesthesia Plan  ASA: III  Anesthesia Plan: General and Regional   Post-op Pain Management: MAC Combined w/ Regional for Post-op pain   Induction: Intravenous  Airway Management Planned: Oral ETT  Additional Equipment:   Intra-op Plan:   Post-operative Plan: Extubation in OR  Informed Consent: I have reviewed the patients History and Physical, chart, labs and discussed the procedure including the risks, benefits and alternatives for the proposed anesthesia with the patient or authorized representative who has indicated his/her understanding and acceptance.     Plan Discussed with: CRNA and Surgeon  Anesthesia Plan Comments:         Anesthesia Quick Evaluation

## 2011-08-13 NOTE — Discharge Instructions (Signed)
Keep ice on the shoulder as much as possible.   Do exercises at least 6 times per day to keep from getting stiff.  No heavy lifting pushing or pulling with the left arm.    Ok to remove the sling in the house.."hug a pillow" works well.  May get the wound wet in the shower 5 days after surgery.

## 2011-08-13 NOTE — Interval H&P Note (Signed)
History and Physical Interval Note:  08/13/2011 7:47 AM  Evelyn Washington  has presented today for surgery, with the diagnosis of left shoulder OA  The various methods of treatment have been discussed with the patient and family. After consideration of risks, benefits and other options for treatment, the patient has consented to  Procedure(s) (LRB): TOTAL SHOULDER ARTHROPLASTY (Left) as a surgical intervention .  The patient's history has been reviewed, patient examined, no change in status, stable for surgery.  I have reviewed the patients' chart and labs.  Questions were answered to the patient's satisfaction.     [name],[name]

## 2011-08-13 NOTE — Progress Notes (Signed)
UR COMPLETED  

## 2011-08-13 NOTE — Progress Notes (Signed)
PHARMACIST - PHYSICIAN ORDER COMMUNICATION  CONCERNING: P&T Medication Policy on Herbal Medications  DESCRIPTION:  This patient's order for:  Potassium 99 mg  has been noted.  This is 1.33mEq of K and is OTC.  This product(s) is classified as an "herbal" or natural product. Due to a lack of definitive safety studies or FDA approval, nonstandard manufacturing practices, plus the potential risk of unknown drug-drug interactions while on inpatient medications, the Pharmacy and Therapeutics Committee does not permit the use of "herbal" or natural products of this type within [facility].   ACTION TAKEN: The pharmacy department is unable to verify this order at this time and your patient has been informed of this safety policy. Please reevaluate patient's clinical condition at discharge and address if the herbal or natural product(s) should be resumed at that time.

## 2011-08-13 NOTE — Preoperative (Signed)
Beta Blockers   Reason not to administer Beta Blockers:Not Applicable 

## 2011-08-13 NOTE — Plan of Care (Signed)
Problem: Consults Goal: Rotator Cuff Repair Patient Education See Patient Education Module for education specifics. Outcome: Completed/Met Date Met:  08/13/11 Pt had left total shoulder replacement

## 2011-08-13 NOTE — Op Note (Signed)
NAMEPHOEBIE, SHAD NO.:  [id number]  MEDICAL RECORD NO.:  [id number]  LOCATION:  5N31C                        FACILITY:  [facility]  PHYSICIAN:  Almedia Balls. Ranell Patrick, M.D. DATE OF BIRTH:  1940/09/06  DATE OF PROCEDURE:  08/13/2011 DATE OF DISCHARGE:                              OPERATIVE REPORT   PREOPERATIVE DIAGNOSIS:  End-stage arthritis, left shoulder.  POSTOPERATIVE DIAGNOSIS:  End-stage arthritis, left shoulder.  PROCEDURE:  Left total shoulder arthroplasty.  DePuy Global Advantage System.  ATTENDING SURGEON:  Almedia Balls. Ranell Patrick, M.D.  FIRST ASSISTANT:  Maisie Fus B. Dixon, PA-C, was scrubbed during the entire procedure and necessary for satisfactory completion of procedure.  ANESTHESIA:  General anesthesia plus interscalene block anesthesia was used.  ESTIMATED BLOOD LOSS:  Minimal.  FLUID REPLACEMENT:  12 mL of crystalloid.  INSTRUMENT COUNTS:  Correct.  COMPLICATIONS:  There were no complications.  Perioperative antibiotics were given.  INDICATIONS:  The patient is a 71 year old female with worsening left shoulder pain with loss of function secondary to end-stage arthritis. The patient has bone-on-bone on x-ray and has a competent rotator cuff based on clinical exam as well as an arthrogram. The patient presents now for operative total shoulder arthroplasty, having failed conservative management, consisting of modification of activities, pain medications, anti-inflammatories, injections and therapy.  Informed consent obtained.  DESCRIPTION OF PROCEDURE:  After adequate level of anesthesia was achieved, the patient was positioned in a modified beach-chair position. Left shoulder was correctly identified and sterilely prepped and draped in usual manner.  Time-out called. We then entered the shoulder by using a standard deltopectoral incision, starting at the coracoid process extending down to the anterior humerus.  Dissection down  through subcutaneous tissues using Bovie electrocautery and identified the deltoid and the pectoralis.  We identified the cephalic vein and took that laterally with the deltoid and then retracted the pectoralis medially.  We identified the conjoined tendon, retracted and placed with deep retractors.  We then released the subscapularis off the lesser tuberosity.  Using a subperiosteal technique with a Bovie, we placed #2 FiberWire suture in a modified Mason-Allen suture technique into the tendon.  We then went ahead and released soft tissue off the humeral neck as we progressively externally rotated the humerus.  We then placed our T-handled Crego elevator at the rotator cuff margin over the articular cartilage.  We placed a neck resection guide and then cut the head off using the oscillating saw.  We basically had her osteotomy right to the level of the rotator cuff insertion.  Next, we prepped our humeral side with sequential reamers and then a punch for a size 10 where we were able to ream up to a size 10 stem and then went ahead and punched out the bone proximally for the 10 stem and that was with the osteotome specific to the global advantage system.  We then went ahead and broached with a size 8 and size 10 broach with global advantage stems and we were pleased with the fit.  We removed that, subluxed the humerus posteriorly, and did a 360-degree release of capsule and removal of capsule as well as removal of the glenoid labrum.  We removed little  cartilages that remaining on the glenoid face.  We identified the center points for that and then drilled the center guide pin.  We then reamed for a 40-glenoid and then placed our center guide hole with a drill bit and then our 3 peripheral holes centered on the 12 o'clock and 6 o'clock relationship of the scapula.  We were able to identify the scapular pillar inferiorly and work after that.  We trialed a 40 glenoid and we were happy with  that.  We then went ahead and cemented the real 40 glenoid and just the peripheral 3 holes, we cemented with pressurizing and at the central PEG hole, we did not.  We impacted the real glenoid in place and held until cement dried, 16 minutes.  We then irrigated the cement and the poly was stable.  We next went ahead and checked our axillary nerve and it was in good condition.  We approached our humeral side.  We trialed with a 10 trial and then with a 44x18 eccentric head and we had great soft tissue balancing and also bony coverage.  We went ahead and removed the trial components.  We drilled holes in the lesser tuberosity for sutures through bone, placed #2 FiberWire sutures through bone for assisting in the repair of the subscapularis tendon.  We then impacted using an impaction grafting technique the size 10 global advantage stem into position with appropriate retroversion.  Once that was impacted into place, then we impacted the 44x18 eccentric head onto the stem.  We then reduced the shoulder and again we were happy with soft tissue balancing and humeral height.  We were able to translate inferiorly 50% of the head diameter and 50% posteriorly.  We then repaired the subscap anatomically including the rotator interval with #2 FiberWire suture, we had anatomic repair.  We were able to externally rotate 45 degrees without undue tension and forward elevation without any impingement.  We thoroughly irrigated.  We then closed the deltopectoral interval with 0 Vicryl suture, followed by 2-0 Vicryl for subcutaneous closure and 4-0 Monocryl for skin.  Steri-Strips applied, followed by sterile dressing.  The patient tolerated surgery well.     Almedia Balls. Ranell Patrick, M.D.     SRN/MEDQ  D:  08/13/2011  T:  08/13/2011  Job:  [id number]

## 2011-08-13 NOTE — Anesthesia Postprocedure Evaluation (Signed)
Anesthesia Post Note  Patient: Evelyn Washington  Procedure(s) Performed: Procedure(s) (LRB): TOTAL SHOULDER ARTHROPLASTY (Left)  Anesthesia type: General  Patient location: PACU  Post pain: Pain level controlled and Adequate analgesia  Post assessment: Post-op Vital signs reviewed, Patient's Cardiovascular Status Stable, Respiratory Function Stable, Patent Airway and Pain level controlled  Last Vitals:  Filed Vitals:   08/13/11 1215  BP:   Pulse: 80  Temp:   Resp: 16    Post vital signs: Reviewed and stable  Level of consciousness: awake, alert  and oriented  Complications: No apparent anesthesia complications

## 2011-08-13 NOTE — Transfer of Care (Signed)
Immediate Anesthesia Transfer of Care Note  Patient: Evelyn Washington  Procedure(s) Performed: Procedure(s) (LRB): TOTAL SHOULDER ARTHROPLASTY (Left)  Patient Location: PACU  Anesthesia Type: General  Level of Consciousness: awake, alert  and oriented  Airway & Oxygen Therapy: Patient Spontanous Breathing and Patient connected to nasal cannula oxygen  Post-op Assessment: Report given to PACU RN, Post -op Vital signs reviewed and stable and Patient moving all extremities  Post vital signs: Reviewed and stable  Complications: No apparent anesthesia complications

## 2011-08-13 NOTE — Progress Notes (Addendum)
Pt reports sharp mid chest pain score "9" when she tries to swallow or take deep breath.  States pain began after nerve block given.  No SOB, VSS.   Anesthesiology notified and MD came to assess patient.   Morphine IV given.  Pt repositioned for comfort.  Spine stimulator turned on per pt.  Continue to monitor closely.

## 2011-08-14 DIAGNOSIS — E1165 Type 2 diabetes mellitus with hyperglycemia: Secondary | ICD-10-CM

## 2011-08-14 DIAGNOSIS — E118 Type 2 diabetes mellitus with unspecified complications: Secondary | ICD-10-CM

## 2011-08-14 DIAGNOSIS — R079 Chest pain, unspecified: Secondary | ICD-10-CM

## 2011-08-14 DIAGNOSIS — IMO0002 Reserved for concepts with insufficient information to code with codable children: Secondary | ICD-10-CM

## 2011-08-14 DIAGNOSIS — I10 Essential (primary) hypertension: Secondary | ICD-10-CM

## 2011-08-14 LAB — CARDIAC PANEL(CRET KIN+CKTOT+MB+TROPI)
CK, MB: 14.4 ng/mL (ref 0.3–4.0)
Relative Index: 0.6 (ref 0.0–2.5)

## 2011-08-14 LAB — BASIC METABOLIC PANEL
CO2: 25 mEq/L (ref 19–32)
Chloride: 102 mEq/L (ref 96–112)
Creatinine, Ser: 1.05 mg/dL (ref 0.50–1.10)

## 2011-08-14 LAB — GLUCOSE, CAPILLARY
Glucose-Capillary: 184 mg/dL — ABNORMAL HIGH (ref 70–99)
Glucose-Capillary: 275 mg/dL — ABNORMAL HIGH (ref 70–99)
Glucose-Capillary: 224 mg/dL — ABNORMAL HIGH (ref 70–99)

## 2011-08-14 LAB — HEMOGLOBIN AND HEMATOCRIT, BLOOD: HCT: 31.6 % — ABNORMAL LOW (ref 36.0–46.0)

## 2011-08-14 LAB — PROTIME-INR
INR: 1.26 (ref 0.00–1.49)
Prothrombin Time: 16.1 seconds — ABNORMAL HIGH (ref 11.6–15.2)

## 2011-08-14 NOTE — Progress Notes (Signed)
Subjective: Patient feeling well, denies chest pain. No breathing to her best. Complaining of left shoulder pain.  Objective: Filed Vitals:   08/13/11 1600 08/13/11 1733 08/13/11 2009 08/14/11 0512  BP: 140/67 103/47 115/66 123/59  Pulse:  89 94 110  Temp:  100.3 F (37.9 C) 99.9 F (37.7 C) 99.5 F (37.5 C)  TempSrc:      Resp:  18 18 18   SpO2: 96% 99% 98% 91%   Weight change:    General: Alert, awake, oriented x3, in no acute distress.  HEENT: No bruits, no goiter.  Heart: Regular rate and rhythm, without murmurs, rubs, gallops.  Lungs: CTA,decrease breath sounds, bilateral air movement.  Abdomen: Soft, nontender, nondistended, positive bowel sounds.  Neuro: Grossly intact, nonfocal. Extremities; Left shoulder with dressing.   Lab Results:  Basename 08/14/11 0500 08/13/11 1815  NA 137 135  K 4.3 4.5  CL 102 100  CO2 25 23  GLUCOSE 181* 224*  BUN 13 13  CREATININE 1.05 0.91  CALCIUM 8.3* 8.8  MG -- --  PHOS -- --    Basename 08/14/11 0500 08/13/11 1815  WBC -- 13.7*  NEUTROABS -- --  HGB 10.2* 11.7*  HCT 31.6* 36.6  MCV -- 92.2  PLT -- 207    Basename 08/14/11 0110 08/13/11 1815  CKTOTAL 2185* 1747*  CKMB 14.4* 13.9*  CKMBINDEX -- --  TROPONINI <0.30 <0.30    Micro Results: Recent Results (from the past 240 hour(s))  SURGICAL PCR SCREEN     Status: Normal   Collection Time   08/06/11 12:55 PM      Component Value Range Status Comment   MRSA, PCR NEGATIVE  NEGATIVE Final    Staphylococcus aureus NEGATIVE  NEGATIVE Final     Studies/Results: Ct Angio Chest W/cm &/or Wo Cm  08/13/2011  *RADIOLOGY REPORT*  Clinical Data: Chest pain, greater on the right, hypoxemia, chest heaviness, history pulmonary embolism, diabetes, hypertension  CT ANGIOGRAPHY CHEST  Technique:  Multidetector CT imaging of the chest using the standard protocol during bolus administration of intravenous contrast. Multiplanar reconstructed images including MIPs were obtained and  reviewed to evaluate the vascular anatomy.  Contrast:  100 ml Isovue 350 IV  Comparison: None  Findings: Degradation of image quality secondary to body habitus and mild respiratory motion. Aorta normal caliber with scattered atherosclerotic calcification. No gross aortic aneurysm or dissection. Minimal coronary arterial calcification. Beam hardening artifacts from left shoulder prosthesis. No definite pulmonary emboli identified. Atelectasis left lower lobe. Minimal subsegmental atelectasis right lung base and in the in right upper chest. No definite pleural effusion or pneumothorax. No thoracic adenopathy. No acute osseous findings.  IMPRESSION: No gross evidence of pulmonary embolism. Left lower lobe and scattered right lung atelectasis.  Original Report Authenticated By: Lollie Marrow, M.D.   Dg Chest Port 1 View  08/13/2011  **ADDENDUM** CREATED: 08/13/2011 18:33:37  New left-sided shoulder arthroplasty also noted.  **END ADDENDUM** SIGNED BY: Florencia Reasons, M.D.   08/13/2011  *RADIOLOGY REPORT*  Clinical Data: Chest pain.  The evaluate for potential pneumothorax.  PORTABLE CHEST - 1 VIEW  Comparison: Chest x-ray 08/06/2011.  Findings: Lung volumes are low.  There are patchy interstitial and airspace opacities throughout the lungs bilaterally, with relative sparing of the left upper lobe.  Bibasilar opacities are also in part related to underlying atelectasis.  There may be some small bilateral pleural effusions, however, assessment is limited by underpenetration of the film and the patient's large body habitus. Heart size  is enlarged. The patient is rotated to the right on today's exam, resulting in distortion of the mediastinal contours and reduced diagnostic sensitivity and specificity for mediastinal pathology.  An infusion pump or spinal cord stimulator device is seen projecting over the lower thoracic spine. No pneumothorax.  IMPRESSION: 1.  Significant worsening aeration throughout the lungs  bilaterally with patchy interstitial and airspace disease there is asymmetrically distributed, concerning for developing multilobar pneumonia. 2.  Probable bibasilar subsegmental atelectasis and small bilateral pleural effusions. 3.  Cardiomegaly is unchanged. 4.  No pneumothorax.  Original Report Authenticated By: Florencia Reasons, M.D.   Dg Shoulder Left Port  08/13/2011  *RADIOLOGY REPORT*  Clinical Data: Post shoulder arthroplasty.  PORTABLE LEFT SHOULDER - 2+ VIEW  Comparison: None  Findings: Changes of left shoulder arthroplasty.  Normal AP alignment.  No hardware or bony complicating feature.  IMPRESSION: Left shoulder replacement.  No complicating feature.  Original Report Authenticated By: Cyndie Chime, M.D.    Medications: I have reviewed the patient's current medications.  1-Chest pain: Probably muscle skeletal pain. Troponin times 3 negative. CT angio negative for PE, atelectasis vs infiltrates. Negative for pneumothorax.  Increase CK-MB likely from muscle recent surgery.  2-Osteoarthrosis, Left shoulder region. Post surgery. Incentive spirometry at bedside.  3-Hypertension: I will hold lisinopril, SBP soft. Continue with lasix. Monitor renal function.  4-Diabetes: Continue to hold  Humulin until tolerates regular diet. Continue with SSI. Continue to hold metformin patient received contrast. 5-Depression: celexa decrease to 20 mg. gabapentin decrease to 300 mg TID to avoid oversedation.  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  7-PNA: Will treat empirically with levaquin due to mild leukocytosis, low grade fever, CT: atelectasis vs infiltrates. Incentive spirometry. Early ambulation.   PT, OT consulted. CM consulted to assist with disposition.    Thank you for consult will continue to follow with you.     LOS: 1 day   Ekansh Sherk M.D.  [facility] 08/14/2011, 7:42 AM

## 2011-08-14 NOTE — Progress Notes (Signed)
Physical Therapy Evaluation Patient Details Name: Evelyn Washington MRN: [id number] DOB: 12-12-1940 Today's Date: 08/14/2011 Time: 4098-1191 PT Time Calculation (min): 20 min  PT Assessment / Plan / Recommendation Clinical Impression  Pt is 71 yo female s/p left TSA who has increased fear of falling but tolerated mobility well and felt better about ambulation after doing so.  Agree that pt would benefit from SNF for short term rehab since she lives alone and is apprehensive with mobility.  PT will follow acutely to improve balance and increase OOB mobility.    PT Assessment  Patient needs continued PT services    Follow Up Recommendations  Skilled nursing facility    Barriers to Discharge None      Equipment Recommendations  Defer to next venue    Recommendations for Other Services     Frequency Min 5X/week    Precautions / Restrictions Precautions Precautions: Shoulder Type of Shoulder Precautions: NWB LUE Precaution Booklet Issued: No Required Braces or Orthoses: Other Brace/Splint Other Brace/Splint: sling LUE for OOB Restrictions Weight Bearing Restrictions: Yes LUE Weight Bearing: Non weight bearing   Pertinent Vitals/Pain 7/10 left shoulder pain, premedicated      Mobility  Bed Mobility Bed Mobility: Rolling Right;Right Sidelying to Sit Rolling Right: 4: Min assist Right Sidelying to Sit: 5: Supervision Supine to Sit: 3: Mod assist;With rails;HOB elevated Details for Bed Mobility Assistance: pt needing min A to get all the way on her side but then able to get herself up to EOB Transfers Transfers: Sit to Stand;Stand to Sit Sit to Stand: 1: +2 Total assist;From bed;From chair/3-in-1 Sit to Stand: Patient Percentage: 80% Stand to Sit: 1: +2 Total assist;To chair/3-in-1 Stand to Sit: Patient Percentage: 80% Details for Transfer Assistance: +2 assist given for pt's confidence and security as she has a high fear of falling and did not know if she would be steady  enough to walk today. Ambulation/Gait Ambulation/Gait Assistance: 1: +2 Total assist Ambulation/Gait: Patient Percentage: 80% Ambulation Distance (Feet): 25 Feet Assistive device: Straight cane Ambulation/Gait Assistance Details: pt given +2 assist for security but only requires +1 and reports that next time she will feel better about walking.  Fear of falling this first time.  Pt with wide BOS due to body habitus, slightly off balance due to sling constraining left UE but no full LOB. Gait Pattern: Step-through pattern;Decreased stride length;Wide base of support Gait velocity: decreased Stairs: No Wheelchair Mobility Wheelchair Mobility: No    Exercises Shoulder Exercises Shoulder Extension: AAROM;10 reps;Seated Shoulder ABduction: AAROM;Seated;10 reps Elbow Flexion: AAROM;10 reps;Seated Elbow Extension: AAROM;10 reps;Seated Wrist Flexion: AROM;10 reps;Seated Wrist Extension: AROM;10 reps;Seated Digit Composite Flexion: AROM;10 reps;Seated Composite Extension: AROM;10 reps;Seated   PT Diagnosis: Generalized weakness;Acute pain  PT Problem List: Decreased strength;Decreased balance;Decreased mobility;Pain;Obesity PT Treatment Interventions: Gait training;DME instruction;Functional mobility training;Therapeutic activities;Therapeutic exercise;Balance training;Patient/family education   PT Goals Acute Rehab PT Goals PT Goal Formulation: With patient Time For Goal Achievement: 08/21/11 Potential to Achieve Goals: Good Pt will go Supine/Side to Sit: with modified independence PT Goal: Supine/Side to Sit - Progress: Goal set today Pt will go Sit to Supine/Side: with modified independence PT Goal: Sit to Supine/Side - Progress: Goal set today Pt will go Sit to Stand: with supervision PT Goal: Sit to Stand - Progress: Goal set today Pt will go Stand to Sit: with supervision PT Goal: Stand to Sit - Progress: Goal set today Pt will Ambulate: 51 - 150 feet;with supervision;with  cane PT Goal: Ambulate - Progress: Goal set  today  Visit Information  Last PT Received On: 08/14/11 Assistance Needed: +1    Subjective Data  Subjective: I have pain all the time Patient Stated Goal: return home   Prior Functioning  Home Living Lives With: Alone Type of Home: Apartment Home Access: Ramped entrance Home Layout: One level Bathroom Shower/Tub: Tub/shower unit;Curtain Firefighter: Standard Home Adaptive Equipment: Grab bars in [employer] - four wheeled Additional Comments: pt to d/c to SNF for rehab Prior Function Level of Independence: Independent with assistive device(s) Driving: Yes Vocation: Retired Musician: No difficulties Dominant Hand: Right    Cognition  Overall Cognitive Status: Appears within functional limits for tasks assessed/performed Arousal/Alertness: Awake/alert Orientation Level: Appears intact for tasks assessed Behavior During Session: [facility] for tasks performed    Extremity/Trunk Assessment Right Upper Extremity Assessment RUE ROM/Strength/Tone: Within functional levels RUE Sensation: WFL - Light Touch RUE Coordination: WFL - gross/fine motor Left Upper Extremity Assessment LUE ROM/Strength/Tone:  (see OT note) LUE Sensation: WFL - Light Touch LUE Coordination:  (unable to fully test due to recent sx) Right Lower Extremity Assessment RLE ROM/Strength/Tone: Deficits RLE ROM/Strength/Tone Deficits: 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, WFL for transfers and ambulation but contributes to imbalance RLE Sensation: WFL - Light Touch RLE Coordination: WFL - gross/fine motor Left Lower Extremity Assessment LLE ROM/Strength/Tone: Deficits LLE ROM/Strength/Tone Deficits: same as RLE LLE Sensation: WFL - Light Touch LLE Coordination: WFL - gross/fine motor Trunk Assessment Trunk Assessment: Other exceptions (scoliosis)   Balance Balance Balance Assessed: Yes Dynamic Standing  Balance Dynamic Standing - Balance Support: Right upper extremity supported;During functional activity Dynamic Standing - Level of Assistance: 4: Min assist  End of Session PT - End of Session Equipment Utilized During Treatment: Gait belt;Other (comment) (sling) Activity Tolerance: Patient tolerated treatment well Patient left: in chair;with call bell/phone within reach Nurse Communication: Mobility status  GP    Lyanne Co, PT  Acute Rehab Services  [phone]  Lyanne Co 08/14/2011, 5:01 PM

## 2011-08-14 NOTE — Progress Notes (Signed)
Orthopedics Progress Note  Subjective: I am hurting.  Objective:  Filed Vitals:   08/14/11 0512  BP: 123/59  Pulse: 110  Temp: 99.5 F (37.5 C)  Resp: 18    General: Awake and alert  Musculoskeletal: shoulder dressing intact, NVI Neurovascularly intact  Lab Results  Component Value Date   WBC 13.7* 08/13/2011   HGB 10.2* 08/14/2011   HCT 31.6* 08/14/2011   MCV 92.2 08/13/2011   PLT 207 08/13/2011       Component Value Date/Time   NA 137 08/14/2011 0500   K 4.3 08/14/2011 0500   CL 102 08/14/2011 0500   CO2 25 08/14/2011 0500   GLUCOSE 181* 08/14/2011 0500   BUN 13 08/14/2011 0500   CREATININE 1.05 08/14/2011 0500   CALCIUM 8.3* 08/14/2011 0500   GFRNONAA 52* 08/14/2011 0500   GFRAA 60* 08/14/2011 0500    Lab Results  Component Value Date   INR 1.26 08/14/2011   INR 1.15 08/13/2011   INR 2.18* 08/06/2011    Assessment/Plan: POD #1 s/p Procedure(s): TOTAL SHOULDER ARTHROPLASTY PT, OT DVT prophylaxis, mechanical and Coumadin, with Lovenox bridge Mobilization D/C Monday with short term SNF/rehab planned  Viviann Spare R. Ranell Patrick, MD 08/14/2011 9:43 AM

## 2011-08-14 NOTE — Progress Notes (Signed)
Pt setup on BIPAP per home regimen. Tolerating well with settings 10/5 with no 02 bleed in. Sat's 93% Hr 100, with RR 20

## 2011-08-14 NOTE — Evaluation (Signed)
Occupational Therapy Evaluation Patient Details Name: Evelyn Washington MRN: [id number] DOB: 1940/12/29 Today's Date: 08/14/2011 Time: 8469-6295 OT Time Calculation (min): 43 min  OT Assessment / Plan / Recommendation Clinical Impression  Pt s/p left TSA and presents with pain, generalized weakness, NWB LUE status, new? urinary incontinence upon standing and decreased overall I with ADL. Pt will benefit from skilled OT in the acute setting to maximize I with LUE managment/exercises, ADL and ADL mobility.     OT Assessment  Patient needs continued OT Services    Follow Up Recommendations  Skilled nursing facility    Barriers to Discharge Decreased caregiver support    Equipment Recommendations  Defer to next venue    Recommendations for Other Services    Frequency  Min 2X/week    Precautions / Restrictions Precautions Precautions: Shoulder Type of Shoulder Precautions: NWB LUE   Pertinent Vitals/Pain Pt reports 4/10 Lt shoulder pain; pre-medicated    ADL  Grooming: Performed;Set up;Wash/dry face Where Assessed - Grooming: Supported sitting Lower Body Dressing: Performed;+1 Total assistance (doff/don socks) Where Assessed - Lower Body Dressing: Supported sitting Toilet Transfer: Performed;Minimal assistance Statistician Method: Surveyor, minerals: Materials engineer and Hygiene: Performed;+1 Total assistance Where Assessed - Engineer, mining and Hygiene: Sit to stand from 3-in-1 or toilet Equipment Used: Gait belt Transfers/Ambulation Related to ADLs: Pt Min A with stand-pivot transfer from EOB to 3n1 to chair. Pt with urinary incontinence each time upon standing; with initial happening of this, pt unsafely attempting to sit on 3n1 before she had reached it. educated pt on safety with transfers.  ADL Comments: Performed AA/ROM at shoulder and elbow. A/ROM at wrist and fingers. Pendulums and AE not done this  session    OT Diagnosis: Generalized weakness;Acute pain  OT Problem List: Decreased strength;Decreased range of motion;Decreased activity tolerance;Impaired balance (sitting and/or standing);Decreased knowledge of use of DME or AE;Decreased safety awareness;Decreased knowledge of precautions;Pain;Impaired UE functional use;Increased edema OT Treatment Interventions: Self-care/ADL training;Therapeutic exercise;Therapeutic activities;Balance training;Patient/family education;DME and/or AE instruction   OT Goals Acute Rehab OT Goals OT Goal Formulation: With patient Time For Goal Achievement: 08/28/11 Potential to Achieve Goals: Good ADL Goals Pt Will Perform Grooming: with modified independence;Standing at sink ADL Goal: Grooming - Progress: Goal set today Pt Will Perform Upper Body Bathing: with min assist;Sitting, edge of bed;Sitting at sink ADL Goal: Upper Body Bathing - Progress: Goal set today Pt Will Perform Upper Body Dressing: with set-up;Sitting, chair;Sitting, bed ADL Goal: Upper Body Dressing - Progress: Goal set today Pt Will Transfer to Toilet: with modified independence;Stand pivot transfer;Ambulation;with DME ADL Goal: Toilet Transfer - Progress: Goal set today Pt Will Perform Toileting - Clothing Manipulation: Standing;Independently ADL Goal: Toileting - Clothing Manipulation - Progress: Goal set today Additional ADL Goal #1: Pt will be I with UB ADL adaptations. ADL Goal: Additional Goal #1 - Progress: Goal set today Additional ADL Goal #2: Pt will I'ly instruct caregiver on sling management. ADL Goal: Additional Goal #2 - Progress: Goal set today Arm Goals Additional Arm Goal #1: Pt will be I with A/ROM elbow and distally to prevent stiffness and lessen edema. Arm Goal: Additional Goal #1 - Progress: Goal set today Additional Arm Goal #2: Pt will I'ly instruct caregiver to provide assist for AA/ROM for Lt shoulder Arm Goal: Additional Goal #2 - Progress: Goal set  today  Visit Information  Last OT Received On: 08/14/11 Assistance Needed: +1    Subjective Data  Subjective: I don't  know why I'm peeing everytime I go to stand.   Prior Functioning  Home Living Lives With: Alone Type of Home: Apartment Home Access: Ramped entrance Home Layout: One level Bathroom Shower/Tub: Tub/shower unit;Curtain Firefighter: Standard Home Adaptive Equipment: Grab bars in [employer] - four wheeled Additional Comments: pt to d/c to SNF for rehab Prior Function Level of Independence: Independent with assistive device(s) Driving: Yes Vocation: Retired Musician: No difficulties Dominant Hand: Right    Cognition  Overall Cognitive Status: Appears within functional limits for tasks assessed/performed    Extremity/Trunk Assessment Right Upper Extremity Assessment RUE ROM/Strength/Tone: Within functional levels RUE Sensation: WFL - Light Touch RUE Coordination: WFL - gross/fine motor Left Upper Extremity Assessment LUE ROM/Strength/Tone: Unable to fully assess (due to sx) LUE Sensation: WFL - Light Touch LUE Coordination:  (unable to fully test due to recent sx)   Mobility Bed Mobility Bed Mobility: Supine to Sit Supine to Sit: 3: Mod assist;With rails;HOB elevated Details for Bed Mobility Assistance: pt with legs already over EOB upon therapist entry; however pt with great difficulty coming into sitting from supine Transfers Transfers: Sit to Stand;Stand to Sit Sit to Stand: 4: Min assist;From bed;From chair/3-in-1 Stand to Sit: 4: Min assist;To bed;To chair/3-in-1 Details for Transfer Assistance: VC for hand placement and sequencing   Exercise Shoulder Exercises Shoulder Extension: AAROM;10 reps;Seated Shoulder ABduction: AAROM;Seated;10 reps Elbow Flexion: AAROM;10 reps;Seated Elbow Extension: AAROM;10 reps;Seated Wrist Flexion: AROM;10 reps;Seated Wrist Extension: AROM;10 reps;Seated Digit Composite Flexion: AROM;10  reps;Seated Composite Extension: AROM;10 reps;Seated  Balance    End of Session OT - End of Session Equipment Utilized During Treatment: Gait belt Activity Tolerance: Patient limited by pain Patient left: in chair;with call bell/phone within reach Nurse Communication: Mobility status  GO     Kortlyn Koltz 08/14/2011, 4:21 PM

## 2011-08-15 DIAGNOSIS — I10 Essential (primary) hypertension: Secondary | ICD-10-CM

## 2011-08-15 DIAGNOSIS — E1165 Type 2 diabetes mellitus with hyperglycemia: Secondary | ICD-10-CM

## 2011-08-15 DIAGNOSIS — E118 Type 2 diabetes mellitus with unspecified complications: Secondary | ICD-10-CM

## 2011-08-15 DIAGNOSIS — R079 Chest pain, unspecified: Secondary | ICD-10-CM

## 2011-08-15 LAB — CBC WITH DIFFERENTIAL/PLATELET
Eosinophils Absolute: 0.1 10*3/uL (ref 0.0–0.7)
Eosinophils Relative: 1 % (ref 0–5)
HCT: 32.5 % — ABNORMAL LOW (ref 36.0–46.0)
Hemoglobin: 10.5 g/dL — ABNORMAL LOW (ref 12.0–15.0)
Lymphocytes Relative: 13 % (ref 12–46)
Lymphs Abs: 1.3 10*3/uL (ref 0.7–4.0)
MCH: 30.1 pg (ref 26.0–34.0)
MCV: 93.1 fL (ref 78.0–100.0)
Monocytes Relative: 8 % (ref 3–12)
RBC: 3.49 MIL/uL — ABNORMAL LOW (ref 3.87–5.11)
WBC: 10.1 10*3/uL (ref 4.0–10.5)

## 2011-08-15 LAB — GLUCOSE, CAPILLARY
Glucose-Capillary: 249 mg/dL — ABNORMAL HIGH (ref 70–99)
Glucose-Capillary: 244 mg/dL — ABNORMAL HIGH (ref 70–99)

## 2011-08-15 LAB — URINALYSIS, ROUTINE W REFLEX MICROSCOPIC
Glucose, UA: 100 mg/dL — AB
Ketones, ur: NEGATIVE mg/dL
Leukocytes, UA: NEGATIVE
Nitrite: NEGATIVE
Specific Gravity, Urine: 1.02 (ref 1.005–1.030)
pH: 6.5 (ref 5.0–8.0)

## 2011-08-15 LAB — BASIC METABOLIC PANEL
BUN: 14 mg/dL (ref 6–23)
CO2: 26 mEq/L (ref 19–32)
Calcium: 8.9 mg/dL (ref 8.4–10.5)
GFR calc non Af Amer: 48 mL/min — ABNORMAL LOW (ref >90)
Glucose, Bld: 265 mg/dL — ABNORMAL HIGH (ref 70–99)
Sodium: 137 mEq/L (ref 135–145)

## 2011-08-15 MED ORDER — SENNOSIDES-DOCUSATE SODIUM 8.6-50 MG PO TABS
1.0000 | ORAL_TABLET | Freq: Two times a day (BID) | ORAL | Status: DC
  Administered 2011-08-15 – 2011-08-16 (×3): 1 via ORAL
  Filled 2011-08-15 (×2): qty 1

## 2011-08-15 MED ORDER — POLYETHYLENE GLYCOL 3350 17 G PO PACK
17.0000 g | PACK | Freq: Two times a day (BID) | ORAL | Status: DC
  Administered 2011-08-15 – 2011-08-16 (×2): 17 g via ORAL
  Filled 2011-08-15 (×2): qty 1

## 2011-08-15 MED ORDER — INSULIN NPH (HUMAN) (ISOPHANE) 100 UNIT/ML ~~LOC~~ SUSP
10.0000 [IU] | Freq: Two times a day (BID) | SUBCUTANEOUS | Status: DC
  Administered 2011-08-15 – 2011-08-16 (×2): 10 [IU] via SUBCUTANEOUS
  Filled 2011-08-15: qty 10

## 2011-08-15 NOTE — Progress Notes (Signed)
Subjective: Patient relates some episodes of incontinence. No BM.  No abdominal pain, no chest pain.   Objective: Filed Vitals:   08/14/11 1400 08/14/11 2028 08/14/11 2350 08/15/11 0454  BP: 132/53 129/49  132/62  Pulse: 68 103 100 101  Temp: 98.9 F (37.2 C) 100.9 F (38.3 C)  100 F (37.8 C)  TempSrc: Oral Oral  Oral  Resp: 18 20 20 20   SpO2: 94% 92% 93% 92%   Weight change:    General: Alert, awake, oriented x3, in no acute distress.  HEENT: No bruits, no goiter.  Heart: Regular rate and rhythm, without murmurs, rubs, gallops.  Lungs: CTA, bilateral air movement.  Abdomen: Soft, nontender, nondistended, positive bowel sounds.  Extremities; left shoulder with swelling, incision appears healing, no drainage.    Lab Results:  Basename 08/15/11 1024 08/14/11 0500  NA 137 137  K 4.1 4.3  CL 102 102  CO2 26 25  GLUCOSE 265* 181*  BUN 14 13  CREATININE 1.13* 1.05  CALCIUM 8.9 8.3*  MG -- --  PHOS -- --   Basename 08/15/11 1024 08/14/11 0500 08/13/11 1815  WBC 10.1 -- 13.7*  NEUTROABS 7.9* -- --  HGB 10.5* 10.2* --  HCT 32.5* 31.6* --  MCV 93.1 -- 92.2  PLT 167 -- 207    Basename 08/14/11 1000 08/14/11 0110 08/13/11 1815  CKTOTAL 1513* 2185* 1747*  CKMB 8.7* 14.4* 13.9*  CKMBINDEX -- -- --  TROPONINI <0.30 <0.30 <0.30    Micro Results: Recent Results (from the past 240 hour(s))  SURGICAL PCR SCREEN     Status: Normal   Collection Time   08/06/11 12:55 PM      Component Value Range Status Comment   MRSA, PCR NEGATIVE  NEGATIVE Final    Staphylococcus aureus NEGATIVE  NEGATIVE Final     Studies/Results: Ct Angio Chest W/cm &/or Wo Cm  08/13/2011  *RADIOLOGY REPORT*  Clinical Data: Chest pain, greater on the right, hypoxemia, chest heaviness, history pulmonary embolism, diabetes, hypertension  CT ANGIOGRAPHY CHEST  Technique:  Multidetector CT imaging of the chest using the standard protocol during bolus administration of intravenous contrast. Multiplanar  reconstructed images including MIPs were obtained and reviewed to evaluate the vascular anatomy.  Contrast:  100 ml Isovue 350 IV  Comparison: None  Findings: Degradation of image quality secondary to body habitus and mild respiratory motion. Aorta normal caliber with scattered atherosclerotic calcification. No gross aortic aneurysm or dissection. Minimal coronary arterial calcification. Beam hardening artifacts from left shoulder prosthesis. No definite pulmonary emboli identified. Atelectasis left lower lobe. Minimal subsegmental atelectasis right lung base and in the in right upper chest. No definite pleural effusion or pneumothorax. No thoracic adenopathy. No acute osseous findings.  IMPRESSION: No gross evidence of pulmonary embolism. Left lower lobe and scattered right lung atelectasis.  Original Report Authenticated By: Lollie Marrow, M.D.   Dg Chest Port 1 View  08/13/2011  **ADDENDUM** CREATED: 08/13/2011 18:33:37  New left-sided shoulder arthroplasty also noted.  **END ADDENDUM** SIGNED BY: Florencia Reasons, M.D.   08/13/2011  *RADIOLOGY REPORT*  Clinical Data: Chest pain.  The evaluate for potential pneumothorax.  PORTABLE CHEST - 1 VIEW  Comparison: Chest x-ray 08/06/2011.  Findings: Lung volumes are low.  There are patchy interstitial and airspace opacities throughout the lungs bilaterally, with relative sparing of the left upper lobe.  Bibasilar opacities are also in part related to underlying atelectasis.  There may be some small bilateral pleural effusions, however, assessment is limited  by underpenetration of the film and the patient's large body habitus. Heart size is enlarged. The patient is rotated to the right on today's exam, resulting in distortion of the mediastinal contours and reduced diagnostic sensitivity and specificity for mediastinal pathology.  An infusion pump or spinal cord stimulator device is seen projecting over the lower thoracic spine. No pneumothorax.  IMPRESSION: 1.   Significant worsening aeration throughout the lungs bilaterally with patchy interstitial and airspace disease there is asymmetrically distributed, concerning for developing multilobar pneumonia. 2.  Probable bibasilar subsegmental atelectasis and small bilateral pleural effusions. 3.  Cardiomegaly is unchanged. 4.  No pneumothorax.  Original Report Authenticated By: Florencia Reasons, M.D.    Medications: I have reviewed the patient's current medications.  1-Chest pain: Probably muscle skeletal pain. Troponin times 3 negative. CT angio negative for PE, atelectasis vs infiltrates. Negative for pneumothorax.  Increase CK-MB likely from muscle, recent surgery.  2-Osteoarthrosis, Left shoulder end stage OA. Post surgery. Incentive spirometry at bedside.  3-Hypertension: hold lisinopril, and lasix, mild increase Cr.  4-Diabetes: resume humulin.  Continue to hold metformin patient received contrast.  5-Depression: celexa decrease to 20 mg. gabapentin decrease to 300 mg TID to avoid oversedation.  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  7-PNA: Will treat empirically with levaquin due to mild leukocytosis, low grade fever, CT: atelectasis vs infiltrates. Incentive spirometry. Early ambulation.  8-Constipation: add Bowel regimen.  9-Urinary incontinence: Check UA.  PT, OT consulted.  SW consulted.  Thank you for consult will continue to follow with you.      LOS: 2 days   Jacobs Golab M.D.  [facility] 08/15/2011, 12:53 PM

## 2011-08-15 NOTE — Progress Notes (Signed)
Clinical Social Work Department BRIEF PSYCHOSOCIAL ASSESSMENT 08/15/2011  Patient:  Evelyn Washington, Evelyn Washington     Account Number:  [id number]     Admit date:  08/13/2011  Clinical Social Worker:  Skip Mayer  Date/Time:  08/15/2011 02:00 PM  Referred by:  Physician  Date Referred:  08/15/2011 Referred for  SNF Placement   Other Referral:   Interview type:  Patient Other interview type:    PSYCHOSOCIAL DATA Living Status:  ALONE Admitted from facility:   Level of care:   Primary support name:  Devon Primary support relationship to patient:  CHILD, ADULT Degree of support available:   Adequate    CURRENT CONCERNS Current Concerns  Post-Acute Placement   Other Concerns:    SOCIAL WORK ASSESSMENT / PLAN CSW met with pt RE: PT/OT recommendation for SNF. CSW explained placement process and answered questions. Pt reports she is from home alone and dtr is supportive. CSW to initiate SNF search in [place]. Weekday CSW to f/u with offers. FL2 on chart for MD signature.   Assessment/plan status:  Information/Referral to [facility] Other assessment/ plan:   Information/referral to community resources:   SNF    PATIENT'S/FAMILY'S RESPONSE TO PLAN OF CARE: Pt reports agreeable to SNF stay in order to increase strength and independence with mobility prior to return home. Pt verbalized understanding of placement process, including [employer] copays for SNF.        Dellie Burns, MSW, [place] [phone] (weekend)

## 2011-08-15 NOTE — Progress Notes (Signed)
    Subjective: 2 Days Post-Op Procedure(s) (LRB): TOTAL SHOULDER ARTHROPLASTY (Left) Patient reports pain as 5 on 0-10 scale.   Denies CP or SOB.  Voiding without difficulty. Positive flatus.  Objective: Vital signs in last 24 hours: Temp:  [98.9 F (37.2 C)-100.9 F (38.3 C)] 100 F (37.8 C) (06/30 0454) Pulse Rate:  [68-103] 101  (06/30 0454) Resp:  [18-20] 20  (06/30 0454) BP: (129-132)/(49-62) 132/62 mmHg (06/30 0454) SpO2:  [92 %-94 %] 92 % (06/30 0454)  Intake/Output from previous day: 06/29 0701 - 06/30 0700 In: 240 [P.O.:240] Out: -  Intake/Output this shift:    Labs:  Basename 08/14/11 0500 08/13/11 1815  HGB 10.2* 11.7*    Basename 08/14/11 0500 08/13/11 1815  WBC -- 13.7*  RBC -- 3.97  HCT 31.6* 36.6  PLT -- 207    Basename 08/14/11 0500 08/13/11 1815  NA 137 135  K 4.3 4.5  CL 102 100  CO2 25 23  BUN 13 13  CREATININE 1.05 0.91  GLUCOSE 181* 224*  CALCIUM 8.3* 8.8    Basename 08/15/11 0415 08/14/11 0500  LABPT -- --  INR 1.32 1.26    Physical Exam: Neurologically intact Neurovascular intact Incision: dressing C/D/I  Assessment/Plan: 2 Days Post-Op Procedure(s) (LRB): TOTAL SHOULDER ARTHROPLASTY (Left) Dressing changed Pain management Continue care Wait SNF discharge  [place] for Dr. Venita Lick [facility] [phone] 08/15/2011, 9:33 AM

## 2011-08-15 NOTE — Progress Notes (Signed)
Physical Therapy Treatment Patient Details Name: Mariadel Mruk MRN: [id number] DOB: [birth year] Today's Date: 08/15/2011 Time: 8413-2440 PT Time Calculation (min): 20 min  PT Assessment / Plan / Recommendation Comments on Treatment Session  Pt required decreased (A) for sit<>stand transfers & ambulation today.      Follow Up Recommendations  Skilled nursing facility    Barriers to Discharge        Equipment Recommendations  Defer to next venue    Recommendations for Other Services    Frequency Min 5X/week   Plan Discharge plan remains appropriate    Precautions / Restrictions Precautions Precautions: Shoulder Type of Shoulder Precautions: NWB LUE Required Braces or Orthoses: Other Brace/Splint Other Brace/Splint: sling LUE for OOB Restrictions Weight Bearing Restrictions: Yes LUE Weight Bearing: Non weight bearing     Pertinent Vitals/Pain 6/10 LUE.  Premedicated.      Mobility  Bed Mobility Bed Mobility: Rolling Right;Right Sidelying to Sit Rolling Right: 4: Min assist Right Sidelying to Sit: 4: Min assist;With rails;HOB flat Details for Bed Mobility Assistance: (A) for shoulders/trunk to upright position.   Transfers Transfers: Sit to Stand;Stand to Sit Sit to Stand: 4: Min assist;With upper extremity assist;With armrests;From bed;From chair/3-in-1 Sit to Stand: Patient Percentage: 90% Stand to Sit: 4: Min guard;With upper extremity assist;With armrests;To chair/3-in-1 Details for Transfer Assistance: Cues for RUE placement, body positioning before sitting.   Ambulation/Gait Ambulation/Gait Assistance: 4: Min guard Ambulation Distance (Feet): 35 Feet Assistive device: Straight cane Ambulation/Gait Assistance Details: Guarding for safety.  Distance limited due to pt c/o LE fatigue & weakness.   Pt reports distance was limited PTA.   Gait Pattern: Step-to pattern;Decreased stride length Stairs: No Wheelchair Mobility Wheelchair Mobility: No    Exercises      PT Diagnosis:    PT Problem List:   PT Treatment Interventions:     PT Goals Acute Rehab PT Goals Time For Goal Achievement: 08/21/11 Potential to Achieve Goals: Good PT Goal: Supine/Side to Sit - Progress: Not met PT Goal: Sit to Stand - Progress: Progressing toward goal PT Goal: Stand to Sit - Progress: Progressing toward goal PT Goal: Ambulate - Progress: Progressing toward goal  Visit Information  Last PT Received On: 08/15/11 Assistance Needed: +1    Subjective Data      Cognition  Overall Cognitive Status: Appears within functional limits for tasks assessed/performed Arousal/Alertness: Awake/alert Orientation Level: Appears intact for tasks assessed Behavior During Session: [facility] for tasks performed    Balance  Balance Balance Assessed: No  End of Session PT - End of Session Equipment Utilized During Treatment: Gait belt;Other (comment) (sling) Activity Tolerance: Patient tolerated treatment well;Patient limited by fatigue Patient left: in chair;with call bell/phone within reach Nurse Communication: Mobility status   GP     Lara Mulch 08/15/2011, 11:16 AM  Verdell Face, PTA [phone] 08/15/2011

## 2011-08-15 NOTE — Progress Notes (Signed)
Clinical Social Work Department CLINICAL SOCIAL WORK PLACEMENT NOTE 08/15/2011  Patient:  Evelyn Washington, Evelyn Washington  Account Number:  [id number] Admit date:  08/13/2011  Clinical Social Worker:  Dellie Burns, Theresia Majors  Date/time:  08/15/2011 02:00 PM  Clinical Social Work is seeking post-discharge placement for this patient at the following level of care:   SKILLED NURSING   (*CSW will update this form in Epic as items are completed)   08/15/2011  Patient/family provided with [facility] Department of Clinical Social Work's list of facilities offering this level of care within the geographic area requested by the patient (or if unable, by the patient's family).  08/15/2011  Patient/family informed of their freedom to choose among providers that offer the needed level of care, that participate in Medicare, Medicaid or managed care program needed by the patient, have an available bed and are willing to accept the patient.  08/15/2011  Patient/family informed of [facility]' ownership interest in [facility], as well as of the fact that they are under no obligation to receive care at this facility.  PASARR submitted to EDS on 08/15/2011 PASARR number received from EDS on 08/15/2011  FL2 transmitted to all facilities in geographic area requested by pt/family on  08/15/2011 FL2 transmitted to all facilities within larger geographic area on   Patient informed that his/her managed care company has contracts with or will negotiate with  certain facilities, including the following:     Patient/family informed of bed offers received:   Patient chooses bed at  Physician recommends and patient chooses bed at    Patient to be transferred to  on   Patient to be transferred to facility by   The following physician request were entered in Epic:   Additional Comments:  Dellie Burns, MSW, LCSWA [phone] (weekend)

## 2011-08-16 ENCOUNTER — Encounter (HOSPITAL_COMMUNITY): Payer: Self-pay | Admitting: Orthopedic Surgery

## 2011-08-16 DIAGNOSIS — M19019 Primary osteoarthritis, unspecified shoulder: Secondary | ICD-10-CM

## 2011-08-16 DIAGNOSIS — E119 Type 2 diabetes mellitus without complications: Secondary | ICD-10-CM

## 2011-08-16 DIAGNOSIS — I10 Essential (primary) hypertension: Secondary | ICD-10-CM

## 2011-08-16 DIAGNOSIS — R079 Chest pain, unspecified: Secondary | ICD-10-CM

## 2011-08-16 LAB — BASIC METABOLIC PANEL
Calcium: 8.9 mg/dL (ref 8.4–10.5)
GFR calc Af Amer: 68 mL/min — ABNORMAL LOW (ref >90)
GFR calc non Af Amer: 59 mL/min — ABNORMAL LOW (ref >90)
Potassium: 3.8 mEq/L (ref 3.5–5.1)
Sodium: 137 mEq/L (ref 135–145)

## 2011-08-16 LAB — PROTIME-INR: INR: 1.4 (ref 0.00–1.49)

## 2011-08-16 MED ORDER — GABAPENTIN 600 MG PO TABS: 300.0000 mg | ORAL_TABLET | Freq: Three times a day (TID) | ORAL | Status: DC

## 2011-08-16 MED ORDER — INSULIN NPH (HUMAN) (ISOPHANE) 100 UNIT/ML ~~LOC~~ SUSP: 20.0000 [IU] | Freq: Two times a day (BID) | SUBCUTANEOUS | Status: DC

## 2011-08-16 MED ORDER — PANTOPRAZOLE SODIUM 40 MG PO TBEC
40.0000 mg | DELAYED_RELEASE_TABLET | Freq: Two times a day (BID) | ORAL | Status: DC
  Administered 2011-08-16: 40 mg via ORAL
  Filled 2011-08-16: qty 1

## 2011-08-16 MED ORDER — METHOCARBAMOL 500 MG PO TABS: 750.0000 mg | ORAL_TABLET | Freq: Three times a day (TID) | ORAL | Status: DC | PRN

## 2011-08-16 MED ORDER — FUROSEMIDE 20 MG PO TABS: 20.0000 mg | ORAL_TABLET | Freq: Every day | ORAL | Status: DC

## 2011-08-16 MED ORDER — LEVOFLOXACIN 750 MG PO TABS: 750.0000 mg | ORAL_TABLET | Freq: Every day | ORAL | Status: AC

## 2011-08-16 MED ORDER — ENOXAPARIN SODIUM 30 MG/0.3ML ~~LOC~~ SOLN: 30.0000 mg | Freq: Two times a day (BID) | SUBCUTANEOUS | Status: DC

## 2011-08-16 MED ORDER — INSULIN NPH (HUMAN) (ISOPHANE) 100 UNIT/ML ~~LOC~~ SUSP: 15.0000 [IU] | Freq: Two times a day (BID) | SUBCUTANEOUS | Status: DC

## 2011-08-16 MED ORDER — CITALOPRAM HYDROBROMIDE 20 MG PO TABS: 20.0000 mg | ORAL_TABLET | Freq: Every day | ORAL | Status: DC

## 2011-08-16 MED ORDER — POLYETHYLENE GLYCOL 3350 17 G PO PACK: 17.0000 g | PACK | Freq: Two times a day (BID) | ORAL | Status: AC

## 2011-08-16 MED ORDER — LEVOFLOXACIN 750 MG PO TABS
750.0000 mg | ORAL_TABLET | Freq: Every day | ORAL | Status: DC
  Filled 2011-08-16: qty 1

## 2011-08-16 MED ORDER — HYDROCODONE-ACETAMINOPHEN 5-325 MG PO TABS: 1.0000 | ORAL_TABLET | Freq: Three times a day (TID) | ORAL | Status: DC | PRN

## 2011-08-16 NOTE — Progress Notes (Addendum)
Subjective: Patient feeling well. Feeling better. She was able to ambulate to the bathroom today. No chest pain or dyspnea. She is passing gas.  Objective: Filed Vitals:   08/15/11 0454 08/15/11 1300 08/15/11 2144 08/16/11 0451  BP: 132/62 130/45 109/46 134/69  Pulse: 101 95 91 99  Temp: 100 F (37.8 C) 98.9 F (37.2 C) 98.1 F (36.7 C) 98.3 F (36.8 C)  TempSrc: Oral Oral Oral Oral  Resp: 20 18 18 20   SpO2: 92% 95% 99% 99%   Weight change:    General: Alert, awake, oriented x3, in no acute distress.  HEENT: No bruits, no goiter.  Heart: Regular rate and rhythm, without murmurs, rubs, gallops.  Lungs: CTA, bilateral air movement.  Abdomen: Soft, nontender, nondistended, positive bowel sounds.  Neuro: Grossly intact, nonfocal. Extremities;   Lab Results:  [facility] 08/16/11 0420 08/15/11 1024  NA 137 137  K 3.8 4.1  CL 101 102  CO2 27 26  GLUCOSE 246* 265*  BUN 12 14  CREATININE 0.95 1.13*  CALCIUM 8.9 8.9  MG -- --  PHOS -- --    Basename 08/15/11 1024 08/14/11 0500 08/13/11 1815  WBC 10.1 -- 13.7*  NEUTROABS 7.9* -- --  HGB 10.5* 10.2* --  HCT 32.5* 31.6* --  MCV 93.1 -- 92.2  PLT 167 -- 207    Basename 08/14/11 1000 08/14/11 0110 08/13/11 1815  CKTOTAL 1513* 2185* 1747*  CKMB 8.7* 14.4* 13.9*  CKMBINDEX -- -- --  TROPONINI <0.30 <0.30 <0.30    Micro Results: Recent Results (from the past 240 hour(s))  SURGICAL PCR SCREEN     Status: Normal   Collection Time   08/06/11 12:55 PM      Component Value Range Status Comment   MRSA, PCR NEGATIVE  NEGATIVE Final    Staphylococcus aureus NEGATIVE  NEGATIVE Final     Studies/Results: No results found.  Medications: I have reviewed the patient's current medications.  1-Chest pain: Probably muscle skeletal pain. Troponin times 3 negative. CT angio negative for PE, atelectasis vs infiltrates. Negative for pneumothorax.  Increase CK-MB likely from muscle, recent surgery.  2-Osteoarthrosis, Left shoulder  end stage OA. Post surgery. Incentive spirometry at bedside.  3-Hypertension: resume lasix. Hold lisinopril.  4-Diabetes: increase humulin.  Resume metformin at discharge.  5-Depression: celexa decrease to 20 mg. gabapentin decrease to 300 mg TID to avoid oversedation.  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  7-PNA: Will treat empirically with levaquin due to mild leukocytosis, low grade fever, CT: atelectasis vs infiltrates. Incentive spirometry. Early ambulation.  Levaquin day 3.  8-Constipation: Continue with miralax, docusate.  9-Urinary incontinence: resolved. 10-History of PE: continue with lovenox until INR therapeutic. She will need INR follow up in 24 hour, patient on levaquin.  Thank you for consult will continue to follow with you.      LOS: 3 days   Mariana Wiederholt M.D.  [facility] 08/16/2011, 12:54 PM

## 2011-08-16 NOTE — Progress Notes (Signed)
Inpatient Diabetes Program Recommendations  AACE/ADA: New Consensus Statement on Inpatient Glycemic Control (2009)  Target Ranges:  Prepandial:   less than 140 mg/dL      Peak postprandial:   less than 180 mg/dL (1-2 hours)      Critically ill patients:  140 - 180 mg/dL   Reason for Visit: Hyperglycemia sustained in 200's If patient is not discharged today, please consider the following: Inpatient Diabetes Program Recommendations Insulin - Basal: Pt history states patient takes NPH  55 units in the am and 60 units at HS.Marland Kitchen  Pt needs increase while here to at least 30 units bid  Note: Thank you, Lenor Coffin, RN, CNS, Diabetes Coordinator [phone])

## 2011-08-16 NOTE — Discharge Summary (Signed)
Physician Discharge Summary  Ev[place] ION:[id number] DOB: 01-24-1941 DOA: 08/13/2011  PCP: [name], MD  Admit date: 08/13/2011 Discharge date: 08/16/2011   Disposition:  Needs INR in 24 hour.   Follow-up Information    Follow up with [name],[name], MD. Call in 2 weeks. [phone])    Contact information:   [facility] [address] [place] [address] [phone]           Discharge Instructions  Discharge Orders    Future Orders Please Complete By Expires   Diet - low sodium heart healthy      Increase activity slowly        Medication List  As of 08/16/2011  1:21 PM   STOP taking these medications         lisinopril 20 MG tablet         TAKE these medications         amitriptyline 50 MG tablet   Commonly known as: ELAVIL   Take 50 mg by mouth at bedtime.      cholecalciferol 1000 UNITS tablet   Commonly known as: VITAMIN D   Take 1,000 Units by mouth daily.      citalopram 20 MG tablet   Commonly known as: CELEXA   Take 1 tablet (20 mg total) by mouth daily.      dorzolamide 2 % ophthalmic solution   Commonly known as: TRUSOPT   Place 1 drop into both eyes 3 (three) times daily.      enoxaparin 30 MG/0.3ML injection   Commonly known as: LOVENOX   Inject 0.3 mLs (30 mg total) into the skin every 12 (twelve) hours. Continue lovenox until INR therapeutic      fentaNYL 50 MCG/HR   Commonly known as: DURAGESIC - dosed mcg/hr   Place 1 patch onto the skin every 3 (three) days.      furosemide 20 MG tablet   Commonly known as: LASIX   Take 20 mg by mouth daily.      gabapentin 600 MG tablet   Commonly known as: NEURONTIN   Take 0.5 tablets (300 mg total) by mouth 4 (four) times daily - after meals and at bedtime.      HYDROcodone-acetaminophen 5-325 MG per tablet   Commonly known as: NORCO   Take 1 tablet by mouth every 8 (eight) hours as needed.      insulin NPH 100 UNIT/ML injection   Commonly known as: HUMULIN N,NOVOLIN N   Inject 20 Units into the skin 2 (two) times daily after a meal.      levofloxacin 750 MG tablet   Commonly known as: LEVAQUIN   Take 1 tablet (750 mg total) by mouth daily at 6 PM.      magnesium oxide 400 MG tablet   Commonly known as: MAG-OX   Take 400 mg by mouth daily.      metFORMIN 1000 MG tablet   Commonly known as: GLUCOPHAGE   Take 1,000 mg by mouth 2 (two) times daily with a meal.      methocarbamol 500 MG tablet   Commonly known as: ROBAXIN   Take 1.5 tablets (750 mg total) by mouth 3 (three) times daily as needed.      polyethylene glycol packet   Commonly known as: MIRALAX / GLYCOLAX   Take 17 g by mouth 2 (two) times daily.      Potassium 99 MG Tabs   Take 99 mg by mouth daily.  risperiDONE 1 MG tablet   Commonly known as: RISPERDAL   Take 1 mg by mouth daily.      simvastatin 40 MG tablet   Commonly known as: ZOCOR   Take 40 mg by mouth every evening.      VITAMIN B-12 SL   Place 1,200 mg under the tongue daily.      vitamin C 500 MG tablet   Commonly known as: ASCORBIC ACID   Take 500 mg by mouth 2 (two) times daily.      warfarin 5 MG tablet   Commonly known as: COUMADIN   Take 5-7.5 mg by mouth daily. Take 7.5 mg on mondays and wednesdays. Take 5 mg the rest of the week.

## 2011-08-16 NOTE — Progress Notes (Signed)
Orthopedics Progress Note  Subjective: My shoulder is feeling better today.  Therapy is going very well.  I am waiting rehab placement.  Objective:  Filed Vitals:   08/16/11 0451  BP: 134/69  Pulse: 99  Temp: 98.3 F (36.8 C)  Resp: 20    General: Awake and alert  Musculoskeletal: shoulder wound looks good.  No erythema. Min swelling. Excellent ROM. Neurovascularly intact  Lab Results  Component Value Date   WBC 10.1 08/15/2011   HGB 10.5* 08/15/2011   HCT 32.5* 08/15/2011   MCV 93.1 08/15/2011   PLT 167 08/15/2011       Component Value Date/Time   NA 137 08/16/2011 0420   K 3.8 08/16/2011 0420   CL 101 08/16/2011 0420   CO2 27 08/16/2011 0420   GLUCOSE 246* 08/16/2011 0420   BUN 12 08/16/2011 0420   CREATININE 0.95 08/16/2011 0420   CALCIUM 8.9 08/16/2011 0420   GFRNONAA 59* 08/16/2011 0420   GFRAA 68* 08/16/2011 0420    Lab Results  Component Value Date   INR 1.40 08/16/2011   INR 1.32 08/15/2011   INR 1.26 08/14/2011    Assessment/Plan: POD #3 s/p Procedure(s): TOTAL SHOULDER ARTHROPLASTY AWaiting placement in short term SNF rehab. PT, OT mobilization Ok for d/c once bed located  [employer]. Ranell Patrick, MD 08/16/2011 12:58 PM

## 2011-08-16 NOTE — Discharge Summary (Signed)
Physician Discharge Summary  Patient ID: Evelyn [place] MRN: [id number] DOB/AGE: May 09, 1940 71 y.o.  Admit date: 08/13/2011 Discharge date: 08/16/2011  Admission Diagnoses:  Principal Problem:  *Osteoarthrosis, unspecified whether generalized or localized, shoulder region Active Problems:  Chest pain  Diabetes mellitus  Hypertension   Discharge Diagnoses:  Same   Surgeries: Procedure(s): TOTAL SHOULDER ARTHROPLASTY on 08/13/2011   Consultants: OT, PT, D/C planning, Internal Medicine  Discharged Condition: Stable  Hospital Course: Evelyn [place] is an 71 y.o. female who was admitted 08/13/2011 with a chief complaint of left shoulder pain, and found to have a diagnosis of Osteoarthrosis, unspecified whether generalized or localized, shoulder region.  They were brought to the operating room on 08/13/2011 and underwent the above named procedures.    The patient had a hospital course complicated by atypical chest pain immediately post op.  Internal Medicine was consulted and she was ruled out for cardiac event.  The patiet and was stable for discharge.    Recent vital signs:  Filed Vitals:   08/16/11 0451  BP: 134/69  Pulse: 99  Temp: 98.3 F (36.8 C)  Resp: 20    Recent laboratory studies:  Results for orders placed during the hospital encounter of 08/13/11  APTT      Component Value Range   aPTT 31  24 - 37 seconds  PROTIME-INR      Component Value Range   Prothrombin Time 14.9  11.6 - 15.2 seconds   INR 1.15  0.00 - 1.49  TYPE AND SCREEN      Component Value Range   ABO/RH(D) A POS     Antibody Screen NEG     Sample Expiration 08/16/2011    GLUCOSE, CAPILLARY      Component Value Range   Glucose-Capillary 78  70 - 99 mg/dL  GLUCOSE, CAPILLARY      Component Value Range   Glucose-Capillary 84  70 - 99 mg/dL  ABO/RH      Component Value Range   ABO/RH(D) A POS    GLUCOSE, CAPILLARY      Component Value Range   Glucose-Capillary 115 (*) 70 - 99 mg/dL    GLUCOSE, CAPILLARY      Component Value Range   Glucose-Capillary 138 (*) 70 - 99 mg/dL   Comment 1 Notify RN    HEMOGLOBIN AND HEMATOCRIT, BLOOD      Component Value Range   Hemoglobin 10.2 (*) 12.0 - 15.0 g/dL   HCT 11.9 (*) 14.7 - 82.9 %  BASIC METABOLIC PANEL      Component Value Range   Sodium 137  135 - 145 mEq/L   Potassium 4.3  3.5 - 5.1 mEq/L   Chloride 102  96 - 112 mEq/L   CO2 25  19 - 32 mEq/L   Glucose, Bld 181 (*) 70 - 99 mg/dL   BUN 13  6 - 23 mg/dL   Creatinine, Ser 5.62  0.50 - 1.10 mg/dL   Calcium 8.3 (*) 8.4 - 10.5 mg/dL   GFR calc non Af Amer 52 (*) >90 mL/min   GFR calc Af Amer 60 (*) >90 mL/min  PROTIME-INR      Component Value Range   Prothrombin Time 16.1 (*) 11.6 - 15.2 seconds   INR 1.26  0.00 - 1.49  CARDIAC PANEL(CRET KIN+CKTOT+MB+TROPI)      Component Value Range   Total CK 1747 (*) 7 - 177 U/L   CK, MB 13.9 (*) 0.3 - 4.0 ng/mL   Troponin I <0.30  <  0.30 ng/mL   Relative Index 0.8  0.0 - 2.5  CARDIAC PANEL(CRET KIN+CKTOT+MB+TROPI)      Component Value Range   Total CK 2185 (*) 7 - 177 U/L   CK, MB 14.4 (*) 0.3 - 4.0 ng/mL   Troponin I <0.30  <0.30 ng/mL   Relative Index 0.7  0.0 - 2.5  CBC      Component Value Range   WBC 13.7 (*) 4.0 - 10.5 K/uL   RBC 3.97  3.87 - 5.11 MIL/uL   Hemoglobin 11.7 (*) 12.0 - 15.0 g/dL   HCT 16.1  09.6 - 04.5 %   MCV 92.2  78.0 - 100.0 fL   MCH 29.5  26.0 - 34.0 pg   MCHC 32.0  30.0 - 36.0 g/dL   RDW 40.9  81.1 - 91.4 %   Platelets 207  150 - 400 K/uL  BASIC METABOLIC PANEL      Component Value Range   Sodium 135  135 - 145 mEq/L   Potassium 4.5  3.5 - 5.1 mEq/L   Chloride 100  96 - 112 mEq/L   CO2 23  19 - 32 mEq/L   Glucose, Bld 224 (*) 70 - 99 mg/dL   BUN 13  6 - 23 mg/dL   Creatinine, Ser 7.82  0.50 - 1.10 mg/dL   Calcium 8.8  8.4 - 95.6 mg/dL   GFR calc non Af Amer 62 (*) >90 mL/min   GFR calc Af Amer 72 (*) >90 mL/min  PRO B NATRIURETIC PEPTIDE      Component Value Range   Pro B Natriuretic  peptide (BNP) 96.1  0 - 125 pg/mL  CARDIAC PANEL(CRET KIN+CKTOT+MB+TROPI)      Component Value Range   Total CK 1513 (*) 7 - 177 U/L   CK, MB 8.7 (*) 0.3 - 4.0 ng/mL   Troponin I <0.30  <0.30 ng/mL   Relative Index 0.6  0.0 - 2.5  GLUCOSE, CAPILLARY      Component Value Range   Glucose-Capillary 184 (*) 70 - 99 mg/dL  GLUCOSE, CAPILLARY      Component Value Range   Glucose-Capillary 275 (*) 70 - 99 mg/dL   Comment 1 Notify RN    GLUCOSE, CAPILLARY      Component Value Range   Glucose-Capillary 224 (*) 70 - 99 mg/dL   Comment 1 Notify RN    PROTIME-INR      Component Value Range   Prothrombin Time 16.6 (*) 11.6 - 15.2 seconds   INR 1.32  0.00 - 1.49  GLUCOSE, CAPILLARY      Component Value Range   Glucose-Capillary 241 (*) 70 - 99 mg/dL  GLUCOSE, CAPILLARY      Component Value Range   Glucose-Capillary 242 (*) 70 - 99 mg/dL  CBC WITH DIFFERENTIAL      Component Value Range   WBC 10.1  4.0 - 10.5 K/uL   RBC 3.49 (*) 3.87 - 5.11 MIL/uL   Hemoglobin 10.5 (*) 12.0 - 15.0 g/dL   HCT 21.3 (*) 08.6 - 57.8 %   MCV 93.1  78.0 - 100.0 fL   MCH 30.1  26.0 - 34.0 pg   MCHC 32.3  30.0 - 36.0 g/dL   RDW 46.9  62.9 - 52.8 %   Platelets 167  150 - 400 K/uL   Neutrophils Relative 78 (*) 43 - 77 %   Neutro Abs 7.9 (*) 1.7 - 7.7 K/uL   Lymphocytes Relative 13  12 - 46 %  Lymphs Abs 1.3  0.7 - 4.0 K/uL   Monocytes Relative 8  3 - 12 %   Monocytes Absolute 0.8  0.1 - 1.0 K/uL   Eosinophils Relative 1  0 - 5 %   Eosinophils Absolute 0.1  0.0 - 0.7 K/uL   Basophils Relative 0  0 - 1 %   Basophils Absolute 0.0  0.0 - 0.1 K/uL  BASIC METABOLIC PANEL      Component Value Range   Sodium 137  135 - 145 mEq/L   Potassium 4.1  3.5 - 5.1 mEq/L   Chloride 102  96 - 112 mEq/L   CO2 26  19 - 32 mEq/L   Glucose, Bld 265 (*) 70 - 99 mg/dL   BUN 14  6 - 23 mg/dL   Creatinine, Ser 9.81 (*) 0.50 - 1.10 mg/dL   Calcium 8.9  8.4 - 19.1 mg/dL   GFR calc non Af Amer 48 (*) >90 mL/min   GFR calc Af  Amer 55 (*) >90 mL/min  GLUCOSE, CAPILLARY      Component Value Range   Glucose-Capillary 292 (*) 70 - 99 mg/dL   Comment 1 Notify RN    URINALYSIS, ROUTINE W REFLEX MICROSCOPIC      Component Value Range   Color, Urine YELLOW  YELLOW   APPearance CLEAR  CLEAR   Specific Gravity, Urine 1.020  1.005 - 1.030   pH 6.5  5.0 - 8.0   Glucose, UA 100 (*) NEGATIVE mg/dL   Hgb urine dipstick NEGATIVE  NEGATIVE   Bilirubin Urine NEGATIVE  NEGATIVE   Ketones, ur NEGATIVE  NEGATIVE mg/dL   Protein, ur NEGATIVE  NEGATIVE mg/dL   Urobilinogen, UA 0.2  0.0 - 1.0 mg/dL   Nitrite NEGATIVE  NEGATIVE   Leukocytes, UA NEGATIVE  NEGATIVE  PROTIME-INR      Component Value Range   Prothrombin Time 17.4 (*) 11.6 - 15.2 seconds   INR 1.40  0.00 - 1.49  BASIC METABOLIC PANEL      Component Value Range   Sodium 137  135 - 145 mEq/L   Potassium 3.8  3.5 - 5.1 mEq/L   Chloride 101  96 - 112 mEq/L   CO2 27  19 - 32 mEq/L   Glucose, Bld 246 (*) 70 - 99 mg/dL   BUN 12  6 - 23 mg/dL   Creatinine, Ser 4.78  0.50 - 1.10 mg/dL   Calcium 8.9  8.4 - 29.5 mg/dL   GFR calc non Af Amer 59 (*) >90 mL/min   GFR calc Af Amer 68 (*) >90 mL/min  GLUCOSE, CAPILLARY      Component Value Range   Glucose-Capillary 249 (*) 70 - 99 mg/dL   Comment 1 Notify RN    GLUCOSE, CAPILLARY      Component Value Range   Glucose-Capillary 244 (*) 70 - 99 mg/dL  GLUCOSE, CAPILLARY      Component Value Range   Glucose-Capillary 210 (*) 70 - 99 mg/dL  GLUCOSE, CAPILLARY      Component Value Range   Glucose-Capillary 249 (*) 70 - 99 mg/dL   Comment 1 Notify RN      Discharge Medications:   Medication List  As of 08/16/2011  1:05 PM   ASK your doctor about these medications         amitriptyline 50 MG tablet   Commonly known as: ELAVIL   Take 50 mg by mouth at bedtime.      cholecalciferol 1000  UNITS tablet   Commonly known as: VITAMIN D   Take 1,000 Units by mouth daily.      citalopram 40 MG tablet   Commonly known as:  CELEXA   Take 40 mg by mouth daily.      dorzolamide 2 % ophthalmic solution   Commonly known as: TRUSOPT   Place 1 drop into both eyes 3 (three) times daily.      fentaNYL 50 MCG/HR   Commonly known as: DURAGESIC - dosed mcg/hr   Place 1 patch onto the skin every 3 (three) days.      furosemide 20 MG tablet   Commonly known as: LASIX   Take 20 mg by mouth daily.      gabapentin 600 MG tablet   Commonly known as: NEURONTIN   Take 600 mg by mouth 4 (four) times daily - after meals and at bedtime.      HUMULIN N [place]   Inject 55-60 Units into the skin 2 (two) times daily. Take 55 units in the morning and 60 units in the evening      HYDROcodone-acetaminophen 5-325 MG per tablet   Commonly known as: NORCO   Take 1 tablet by mouth every 6 (six) hours as needed.      lisinopril 20 MG tablet   Commonly known as: PRINIVIL,ZESTRIL   Take 20 mg by mouth daily.      magnesium oxide 400 MG tablet   Commonly known as: MAG-OX   Take 400 mg by mouth daily.      metFORMIN 1000 MG tablet   Commonly known as: GLUCOPHAGE   Take 1,000 mg by mouth 2 (two) times daily with a meal.      methocarbamol 750 MG tablet   Commonly known as: ROBAXIN   Take 750 mg by mouth 3 (three) times daily.      Potassium 99 MG Tabs   Take 99 mg by mouth daily.      risperiDONE 1 MG tablet   Commonly known as: RISPERDAL   Take 1 mg by mouth daily.      simvastatin 40 MG tablet   Commonly known as: ZOCOR   Take 40 mg by mouth every evening.      VITAMIN B-12 SL   Place 1,200 mg under the tongue daily.      vitamin C 500 MG tablet   Commonly known as: ASCORBIC ACID   Take 500 mg by mouth 2 (two) times daily.      warfarin 5 MG tablet   Commonly known as: COUMADIN   Take 5-7.5 mg by mouth daily. Take 7.5 mg on mondays and wednesdays. Take 5 mg the rest of the week.            Diagnostic Studies: Dg Chest 2 View  08/06/2011  *RADIOLOGY REPORT*  Clinical Data: Preoperative study for left  shoulder arthroplasty.  CHEST - 2 VIEW  Comparison: No priors.  Findings: Lung volumes are normal.  No consolidative airspace disease.  No pleural effusions.  No pneumothorax.  No pulmonary nodule or mass noted.  Pulmonary vasculature and the cardiomediastinal silhouette are within normal limits. Atherosclerosis in the thoracic aorta.  The infusion pump for spinal cord stimulator is seen projecting over the mid thoracic spine.  IMPRESSION: 1. No radiographic evidence of acute cardiopulmonary disease. 2.  Atherosclerosis. 3.  Infusion pump or spinal cord stimulator projecting over the thoracic spine.  Original Report Authenticated By: Florencia Reasons, M.D.   Ct Angio  Chest W/cm &/or Wo Cm  08/13/2011  *RADIOLOGY REPORT*  Clinical Data: Chest pain, greater on the right, hypoxemia, chest heaviness, history pulmonary embolism, diabetes, hypertension  CT ANGIOGRAPHY CHEST  Technique:  Multidetector CT imaging of the chest using the standard protocol during bolus administration of intravenous contrast. Multiplanar reconstructed images including MIPs were obtained and reviewed to evaluate the vascular anatomy.  Contrast:  100 ml Isovue 350 IV  Comparison: None  Findings: Degradation of image quality secondary to body habitus and mild respiratory motion. Aorta normal caliber with scattered atherosclerotic calcification. No gross aortic aneurysm or dissection. Minimal coronary arterial calcification. Beam hardening artifacts from left shoulder prosthesis. No definite pulmonary emboli identified. Atelectasis left lower lobe. Minimal subsegmental atelectasis right lung base and in the in right upper chest. No definite pleural effusion or pneumothorax. No thoracic adenopathy. No acute osseous findings.  IMPRESSION: No gross evidence of pulmonary embolism. Left lower lobe and scattered right lung atelectasis.  Original Report Authenticated By: Lollie Marrow, M.D.   Dg Chest Port 1 View  08/13/2011  **ADDENDUM** CREATED:  08/13/2011 18:33:37  New left-sided shoulder arthroplasty also noted.  **END ADDENDUM** SIGNED BY: Florencia Reasons, M.D.   08/13/2011  *RADIOLOGY REPORT*  Clinical Data: Chest pain.  The evaluate for potential pneumothorax.  PORTABLE CHEST - 1 VIEW  Comparison: Chest x-ray 08/06/2011.  Findings: Lung volumes are low.  There are patchy interstitial and airspace opacities throughout the lungs bilaterally, with relative sparing of the left upper lobe.  Bibasilar opacities are also in part related to underlying atelectasis.  There may be some small bilateral pleural effusions, however, assessment is limited by underpenetration of the film and the patient's large body habitus. Heart size is enlarged. The patient is rotated to the right on today's exam, resulting in distortion of the mediastinal contours and reduced diagnostic sensitivity and specificity for mediastinal pathology.  An infusion pump or spinal cord stimulator device is seen projecting over the lower thoracic spine. No pneumothorax.  IMPRESSION: 1.  Significant worsening aeration throughout the lungs bilaterally with patchy interstitial and airspace disease there is asymmetrically distributed, concerning for developing multilobar pneumonia. 2.  Probable bibasilar subsegmental atelectasis and small bilateral pleural effusions. 3.  Cardiomegaly is unchanged. 4.  No pneumothorax.  Original Report Authenticated By: Florencia Reasons, M.D.   Dg Shoulder Left Port  08/13/2011  *RADIOLOGY REPORT*  Clinical Data: Post shoulder arthroplasty.  PORTABLE LEFT SHOULDER - 2+ VIEW  Comparison: None  Findings: Changes of left shoulder arthroplasty.  Normal AP alignment.  No hardware or bony complicating feature.  IMPRESSION: Left shoulder replacement.  No complicating feature.  Original Report Authenticated By: Cyndie Chime, M.D.    Disposition: discharged to SNF Rehab in [facility])    Follow-up Information    Follow up with Verlee Rossetti,  MD. Call in 2 weeks. [phone])    Contact information:   [facility] [address] [place] [address] [phone]           Signed: Verlee Rossetti 08/16/2011, 1:05 PM

## 2011-08-16 NOTE — Progress Notes (Signed)
Pt. Refuses CPAP at this time due to she doesn't have her home mask.  Pt. States the machine can be removed from the room at this time. Pt. Encouraged to call RT is she wishes to wear CPAP. CPAP unit removed from pt. Room.

## 2011-08-16 NOTE — Progress Notes (Signed)
Physical Therapy Treatment Patient Details Name: Evelyn [place] MRN: [id number] DOB: 1940/05/26 Today's Date: 08/16/2011 Time: 1350-1407 PT Time Calculation (min): 17 min  PT Assessment / Plan / Recommendation Comments on Treatment Session  Pt reports she's beginning to feel stronger & mobility getting easier.      Follow Up Recommendations  Skilled nursing facility    Barriers to Discharge        Equipment Recommendations  Defer to next venue    Recommendations for Other Services    Frequency Min 5X/week   Plan Discharge plan remains appropriate    Precautions / Restrictions Precautions Precautions: Shoulder Type of Shoulder Precautions: NWB LUE Required Braces or Orthoses: Other Brace/Splint Other Brace/Splint: sling LUE for OOB Restrictions Weight Bearing Restrictions: Yes LUE Weight Bearing: Non weight bearing       Mobility  Bed Mobility Bed Mobility: Not assessed Transfers Transfers: Sit to Stand;Stand to Sit Sit to Stand: 5: Supervision;With upper extremity assist;With armrests;From chair/3-in-1 Stand to Sit: 5: Supervision;With upper extremity assist;With armrests;To chair/3-in-1 Details for Transfer Assistance: Performed 5x's for activity tolerance & strengthening.  Pt uses cane to (A) with standing.   Ambulation/Gait Ambulation/Gait Assistance: 4: Min guard Ambulation Distance (Feet): 50 Feet Assistive device: Straight cane Ambulation/Gait Assistance Details: Pt reports she is beginning to feel more steady & stronger but as distance increases reports bil knees feeling weak.   DOE noted with ambulation.  Small shuffle-like steps with increased lateral sway L<>R.   Gait Pattern: Step-through pattern;Decreased stride length;Decreased hip/knee flexion - right;Decreased hip/knee flexion - left Gait velocity: decreased Stairs: No Wheelchair Mobility Wheelchair Mobility: No    Exercises        PT Goals Acute Rehab PT Goals Time For Goal Achievement:  08/21/11 Potential to Achieve Goals: Good PT Goal: Sit to Stand - Progress: Met PT Goal: Stand to Sit - Progress: Met PT Goal: Ambulate - Progress: Progressing toward goal  Visit Information  Last PT Received On: 08/16/11 Assistance Needed: +1    Subjective Data      Cognition  Overall Cognitive Status: Appears within functional limits for tasks assessed/performed Arousal/Alertness: Awake/alert Orientation Level: Appears intact for tasks assessed Behavior During Session: [facility] for tasks performed    Balance  Balance Balance Assessed: No  End of Session PT - End of Session Equipment Utilized During Treatment:  (sling) Activity Tolerance: Patient tolerated treatment well;Patient limited by fatigue Patient left: in chair;with call bell/phone within reach   GP     Lara Mulch 08/16/2011, 2:19 PM  Verdell Face, PTA [phone] 08/16/2011

## 2011-08-16 NOTE — Progress Notes (Signed)
CARE MANAGEMENT NOTE 08/16/2011  Patient:  [place]elyn [place], Evelyn [place]   Account Number:  [id number]  Date Initiated:  08/16/2011  Documentation initiated by:  Vance Peper  Subjective/Objective Assessment:   71 yr old female s/p left shoulder arthroplasty     Action/Plan:   Patient going to [facility] SNF for shortterm rehab.   Anticipated DC Date:  08/16/2011   Anticipated DC Plan:  SKILLED NURSING FACILITY  In-house referral  Clinical Social Worker      DC Planning Services  CM consult      Choice offered to / List presented to:             Status of service:  Completed, signed off Medicare Important Message given?   (If response is "NO", the following Medicare IM given date fields will be blank) Date Medicare IM given:   Date Additional Medicare IM given:    Discharge Disposition:  SKILLED NURSING FACILITY

## 2011-08-17 LAB — GLUCOSE, CAPILLARY

## 2011-11-22 ENCOUNTER — Ambulatory Visit: Payer: Self-pay | Admitting: Family Medicine

## 2012-01-07 ENCOUNTER — Ambulatory Visit: Payer: Self-pay | Admitting: Neurology

## 2012-02-06 ENCOUNTER — Emergency Department: Payer: Self-pay | Admitting: Internal Medicine

## 2012-02-06 LAB — CBC
HGB: 11.9 g/dL — ABNORMAL LOW (ref 12.0–16.0)
MCH: 29.8 pg (ref 26.0–34.0)
MCHC: 33 g/dL (ref 32.0–36.0)
RBC: 4 10*6/uL (ref 3.80–5.20)
WBC: 7 10*3/uL (ref 3.6–11.0)

## 2012-02-06 LAB — COMPREHENSIVE METABOLIC PANEL
Albumin: 3.4 g/dL (ref 3.4–5.0)
Alkaline Phosphatase: 95 U/L (ref 50–136)
Bilirubin,Total: 0.4 mg/dL (ref 0.2–1.0)
Calcium, Total: 8.3 mg/dL — ABNORMAL LOW (ref 8.5–10.1)
Creatinine: 1.05 mg/dL (ref 0.60–1.30)
EGFR (African American): >60
EGFR (Non-African Amer.): 53 — ABNORMAL LOW
Glucose: 179 mg/dL — ABNORMAL HIGH (ref 65–99)
SGOT(AST): 37 U/L (ref 15–37)
SGPT (ALT): 37 U/L (ref 12–78)
Sodium: 140 mmol/L (ref 136–145)

## 2012-02-06 LAB — CK TOTAL AND CKMB (NOT AT ARMC): CK, Total: 60 U/L (ref 21–215)

## 2012-02-06 LAB — TROPONIN I: Troponin-I: <0.02 ng/mL

## 2012-02-23 LAB — URINALYSIS, COMPLETE
Bilirubin,UR: NEGATIVE
Blood: NEGATIVE
Glucose,UR: NEGATIVE mg/dL (ref 0–75)
Ketone: NEGATIVE
Leukocyte Esterase: NEGATIVE
Nitrite: NEGATIVE
Ph: 5 (ref 4.5–8.0)
RBC,UR: 1 /HPF (ref 0–5)
WBC UR: 1 /HPF (ref 0–5)

## 2012-02-23 LAB — BASIC METABOLIC PANEL
BUN: 15 mg/dL (ref 7–18)
Calcium, Total: 8.4 mg/dL — ABNORMAL LOW (ref 8.5–10.1)
Chloride: 108 mmol/L — ABNORMAL HIGH (ref 98–107)
Co2: 27 mmol/L (ref 21–32)
Creatinine: 1.04 mg/dL (ref 0.60–1.30)
EGFR (Non-African Amer.): 54 — ABNORMAL LOW
Potassium: 3.8 mmol/L (ref 3.5–5.1)
Sodium: 143 mmol/L (ref 136–145)

## 2012-02-23 LAB — CBC
HGB: 12.8 g/dL (ref 12.0–16.0)
MCH: 31.7 pg (ref 26.0–34.0)
MCHC: 34.5 g/dL (ref 32.0–36.0)
MCV: 92 fL (ref 80–100)
RDW: 16.2 % — ABNORMAL HIGH (ref 11.5–14.5)
WBC: 8.5 10*3/uL (ref 3.6–11.0)

## 2012-02-23 LAB — PROTIME-INR: INR: 2.1

## 2012-02-24 ENCOUNTER — Observation Stay: Payer: Self-pay | Admitting: Family Medicine

## 2012-07-01 ENCOUNTER — Emergency Department: Payer: Self-pay | Admitting: Emergency Medicine

## 2012-07-01 LAB — URINALYSIS, COMPLETE
Ketone: NEGATIVE
Ph: 5 (ref 4.5–8.0)
Protein: NEGATIVE
RBC,UR: 2 /HPF (ref 0–5)
Squamous Epithelial: 2
WBC UR: 15 /HPF (ref 0–5)

## 2012-07-01 LAB — SALICYLATE LEVEL: Salicylates, Serum: <1.7 mg/dL

## 2012-07-01 LAB — COMPREHENSIVE METABOLIC PANEL
Albumin: 3.5 g/dL (ref 3.4–5.0)
Anion Gap: 6 — ABNORMAL LOW (ref 7–16)
BUN: 12 mg/dL (ref 7–18)
Calcium, Total: 8.6 mg/dL (ref 8.5–10.1)
Co2: 30 mmol/L (ref 21–32)
Creatinine: 1.02 mg/dL (ref 0.60–1.30)
EGFR (African American): >60
EGFR (Non-African Amer.): 55 — ABNORMAL LOW
Glucose: 212 mg/dL — ABNORMAL HIGH (ref 65–99)
Potassium: 3.9 mmol/L (ref 3.5–5.1)
SGPT (ALT): 41 U/L (ref 12–78)
Total Protein: 6.5 g/dL (ref 6.4–8.2)

## 2012-07-01 LAB — DRUG SCREEN, URINE
Amphetamines, Ur Screen: NEGATIVE (ref ?–1000)
Barbiturates, Ur Screen: NEGATIVE (ref ?–200)
Cannabinoid 50 Ng, Ur ~~LOC~~: NEGATIVE (ref ?–50)
Cocaine Metabolite,Ur ~~LOC~~: NEGATIVE (ref ?–300)
MDMA (Ecstasy)Ur Screen: NEGATIVE (ref ?–500)
Methadone, Ur Screen: NEGATIVE (ref ?–300)
Phencyclidine (PCP) Ur S: NEGATIVE (ref ?–25)

## 2012-07-01 LAB — ETHANOL
Ethanol %: <0.003 % (ref 0.000–0.080)
Ethanol: <3 mg/dL

## 2012-07-01 LAB — CBC
HGB: 11.5 g/dL — ABNORMAL LOW (ref 12.0–16.0)
MCHC: 33.3 g/dL (ref 32.0–36.0)
MCV: 90 fL (ref 80–100)
Platelet: 174 10*3/uL (ref 150–440)
RDW: 15 % — ABNORMAL HIGH (ref 11.5–14.5)

## 2012-07-01 LAB — PROTIME-INR: INR: 2.4

## 2012-07-04 LAB — URINE CULTURE

## 2013-01-15 IMAGING — CT CT LUMBAR SPINE WITHOUT CONTRAST
1 of 2 series · 11 of 14 positions shown, 14 images · non-contrast
Comparison: none

REASON FOR EXAM: severe pain x 1 month  now with urinary and bowel
incontinece    [HOSPITAL]
COMMENTS:

[Series 4: bone # 2 · axial · 0.30mm/px · z∈[-558,-390]mm · 11 of 68 slices shown, 14 images]
[im 6/68  soft-tissue]
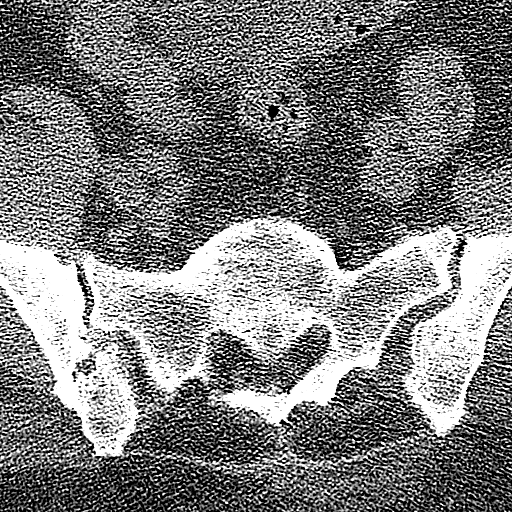
[im 6/68  bone]
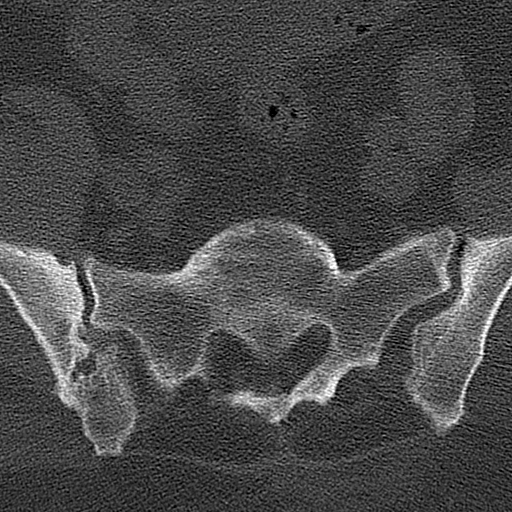
[im 12/68  bone]
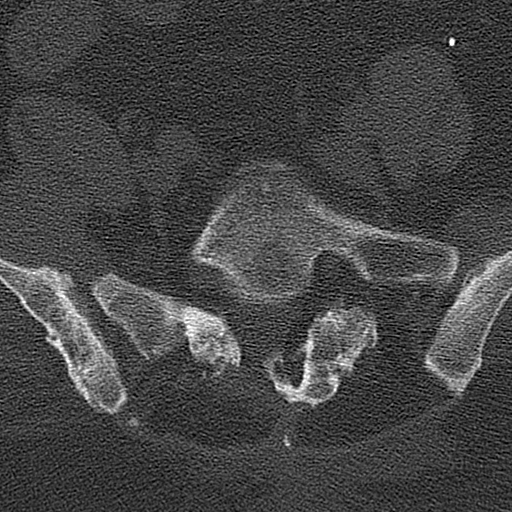
[im 17/68  bone]
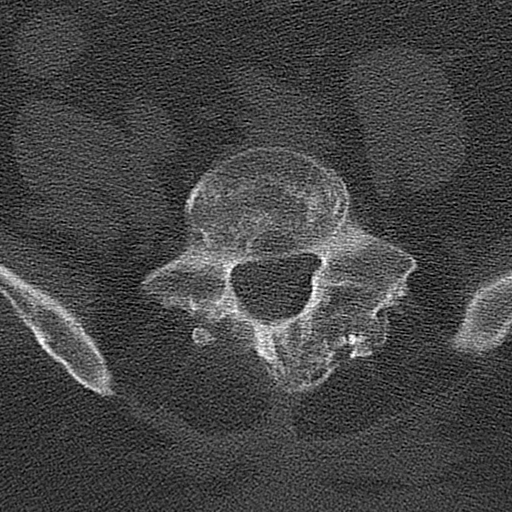
[im 23/68  bone]
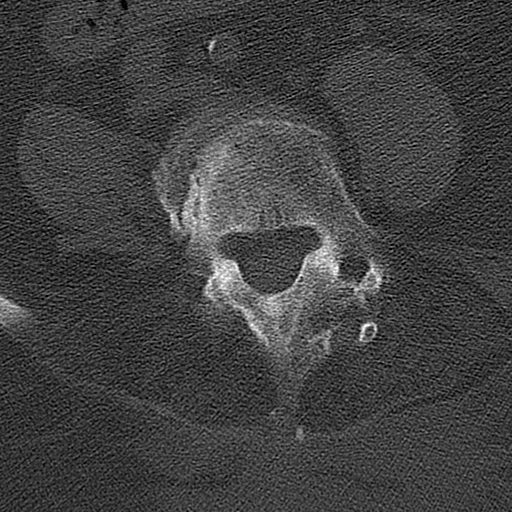
[im 28/68  soft-tissue]
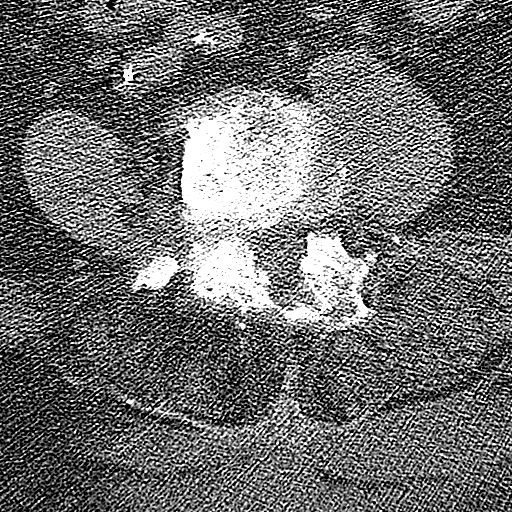
[im 28/68  bone]
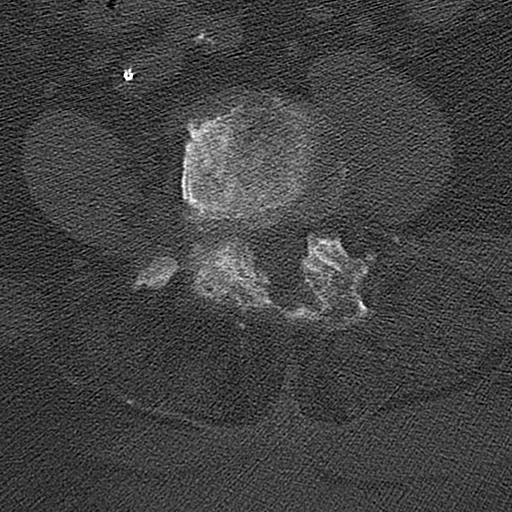
[im 34/68  bone]
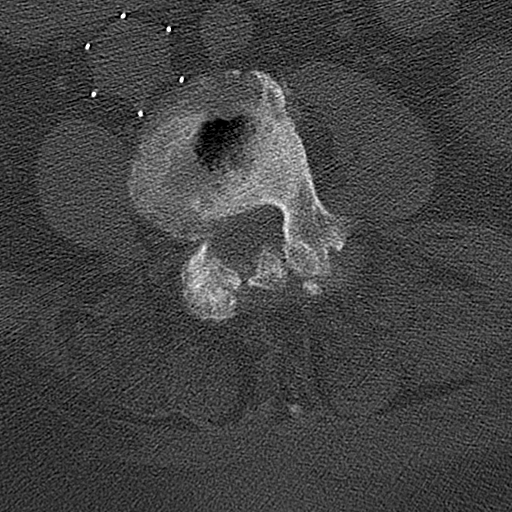
[im 40/68  bone]
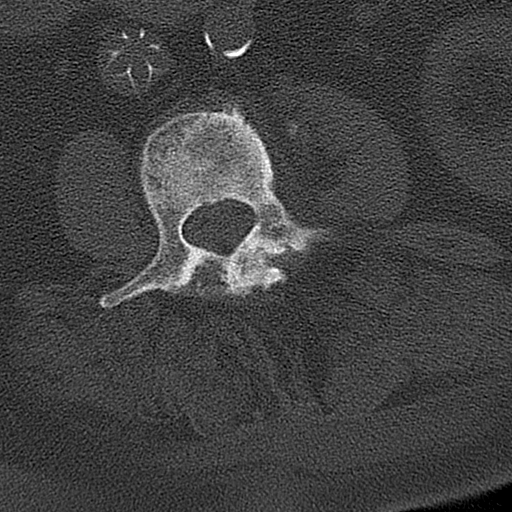
[im 45/68  bone]
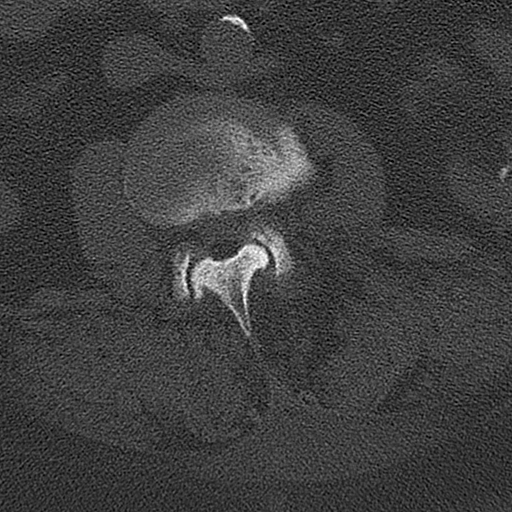
[im 51/68  soft-tissue]
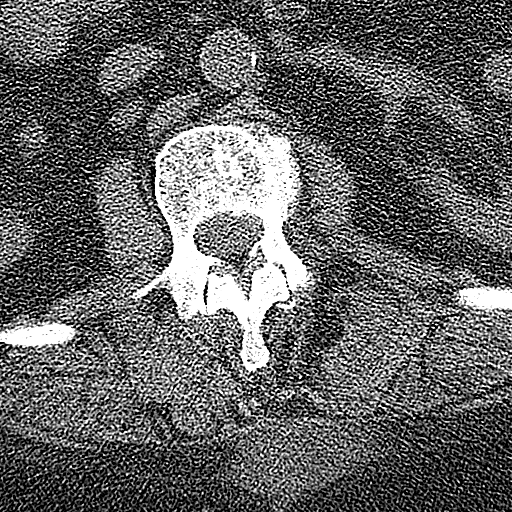
[im 51/68  bone]
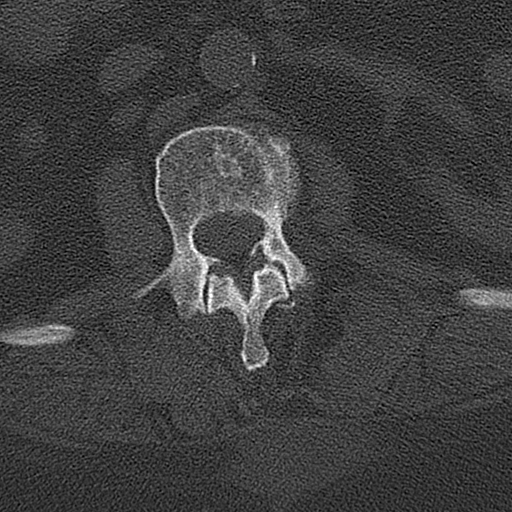
[im 56/68  bone]
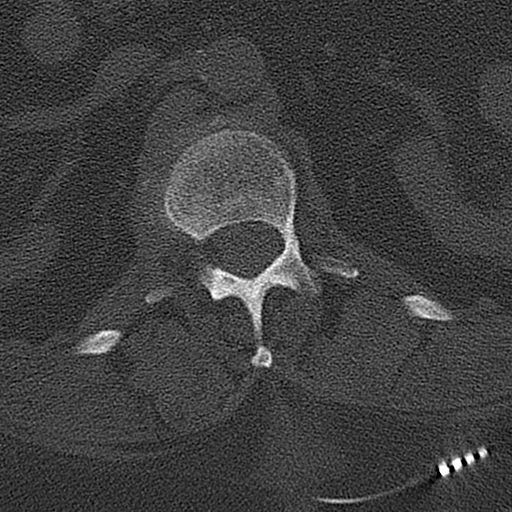
[im 62/68  bone]
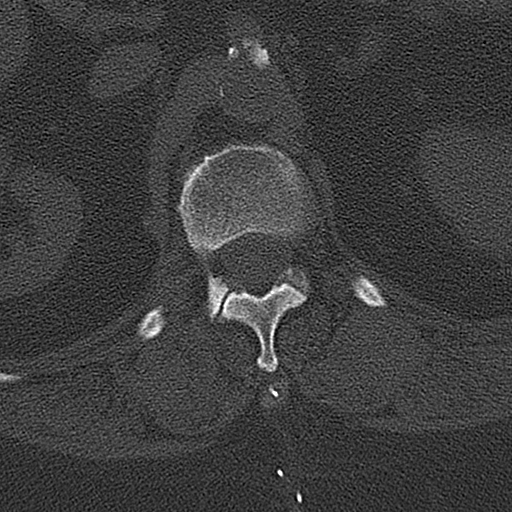

[11 of 14 positions shown; findings below may reference images not displayed]

PROCEDURE:     CT  - CT LUMBAR SPINE WO  - February 23, 2012  [DATE]

RESULT:     Multislice helical acquisition through the lumbar spine is
reconstructed at bone window settings in the axial, coronal and sagittal
planes at 3 mm collimation. There is no previous similar study for
comparison area there severe chronic degenerative changes involving the
lumbar spine with prominent multilevel degenerative disc space narrowing and
scoliosis with pronounced hypertrophic spurring as well as facet
hypertrophy. No fracture is appreciated. An inferior vena cava filter is
present. Severe degenerative endplate sclerotic changes are present at L3-L4
and L2-L3. There is no compression fracture or significant subluxation.
Prominent spurring and likely chronic disc protrusion is seen at L4-L5 and
L3-L4 and to a lesser extent at L2-L3 and L1-L2 the
IMPRESSION: 1. Severe degenerative changes without evidence of acute bony abnormality.
Chronic degenerative disc disease.

[REDACTED]

## 2013-02-05 ENCOUNTER — Emergency Department: Payer: Self-pay | Admitting: Emergency Medicine

## 2013-05-26 ENCOUNTER — Emergency Department: Payer: Self-pay | Admitting: Emergency Medicine

## 2013-05-26 LAB — TROPONIN I: Troponin-I: <0.02 ng/mL

## 2013-05-26 LAB — COMPREHENSIVE METABOLIC PANEL
ALBUMIN: 3.9 g/dL (ref 3.4–5.0)
ALK PHOS: 37 U/L — AB
ANION GAP: 5 — AB (ref 7–16)
AST: 30 U/L (ref 15–37)
BILIRUBIN TOTAL: 0.4 mg/dL (ref 0.2–1.0)
BUN: 17 mg/dL (ref 7–18)
CREATININE: 1.22 mg/dL (ref 0.60–1.30)
Calcium, Total: 9.1 mg/dL (ref 8.5–10.1)
Chloride: 109 mmol/L — ABNORMAL HIGH (ref 98–107)
Co2: 29 mmol/L (ref 21–32)
EGFR (Non-African Amer.): 44 — ABNORMAL LOW
GFR CALC AF AMER: 51 — AB
Glucose: 119 mg/dL — ABNORMAL HIGH (ref 65–99)
Osmolality: 288 (ref 275–301)
POTASSIUM: 4.1 mmol/L (ref 3.5–5.1)
SGPT (ALT): 24 U/L (ref 12–78)
Sodium: 143 mmol/L (ref 136–145)
Total Protein: 7 g/dL (ref 6.4–8.2)

## 2013-05-26 LAB — URINALYSIS, COMPLETE
BILIRUBIN, UR: NEGATIVE
BLOOD: NEGATIVE
Bacteria: NONE SEEN
Glucose,UR: NEGATIVE mg/dL (ref 0–75)
Ketone: NEGATIVE
LEUKOCYTE ESTERASE: NEGATIVE
NITRITE: NEGATIVE
PH: 6 (ref 4.5–8.0)
Protein: NEGATIVE
RBC,UR: <1 /HPF (ref 0–5)
Specific Gravity: 1.006 (ref 1.003–1.030)
Squamous Epithelial: NONE SEEN
WBC UR: 1 /HPF (ref 0–5)

## 2013-05-26 LAB — CBC
HCT: 34.1 % — AB (ref 35.0–47.0)
HGB: 11.1 g/dL — ABNORMAL LOW (ref 12.0–16.0)
MCH: 30.9 pg (ref 26.0–34.0)
MCHC: 32.5 g/dL (ref 32.0–36.0)
MCV: 95 fL (ref 80–100)
PLATELETS: 215 10*3/uL (ref 150–440)
RBC: 3.6 10*6/uL — AB (ref 3.80–5.20)
RDW: 14.3 % (ref 11.5–14.5)
WBC: 7.5 10*3/uL (ref 3.6–11.0)

## 2013-06-08 ENCOUNTER — Emergency Department: Payer: Self-pay | Admitting: Emergency Medicine

## 2013-06-08 LAB — URINALYSIS, COMPLETE
BACTERIA: NONE SEEN
BILIRUBIN, UR: NEGATIVE
BLOOD: NEGATIVE
GLUCOSE, UR: NEGATIVE mg/dL (ref 0–75)
KETONE: NEGATIVE
LEUKOCYTE ESTERASE: NEGATIVE
NITRITE: POSITIVE
Ph: 6 (ref 4.5–8.0)
Protein: NEGATIVE
RBC,UR: 2 /HPF (ref 0–5)
Specific Gravity: 1.01 (ref 1.003–1.030)

## 2013-06-08 LAB — COMPREHENSIVE METABOLIC PANEL
ALK PHOS: 37 U/L — AB
ALT: 19 U/L (ref 12–78)
Albumin: 3.5 g/dL (ref 3.4–5.0)
Anion Gap: 8 (ref 7–16)
BILIRUBIN TOTAL: 0.4 mg/dL (ref 0.2–1.0)
BUN: 20 mg/dL — ABNORMAL HIGH (ref 7–18)
Calcium, Total: 9.2 mg/dL (ref 8.5–10.1)
Chloride: 108 mmol/L — ABNORMAL HIGH (ref 98–107)
Co2: 29 mmol/L (ref 21–32)
Creatinine: 1.6 mg/dL — ABNORMAL HIGH (ref 0.60–1.30)
EGFR (African American): 37 — ABNORMAL LOW
EGFR (Non-African Amer.): 32 — ABNORMAL LOW
GLUCOSE: 133 mg/dL — AB (ref 65–99)
OSMOLALITY: 293 (ref 275–301)
Potassium: 4.2 mmol/L (ref 3.5–5.1)
SGOT(AST): 22 U/L (ref 15–37)
SODIUM: 145 mmol/L (ref 136–145)
TOTAL PROTEIN: 6.5 g/dL (ref 6.4–8.2)

## 2013-06-08 LAB — CBC WITH DIFFERENTIAL/PLATELET
Basophil #: 0 10*3/uL (ref 0.0–0.1)
Basophil %: 0.5 %
EOS ABS: 0.1 10*3/uL (ref 0.0–0.7)
EOS PCT: 0.8 %
HCT: 33.3 % — AB (ref 35.0–47.0)
HGB: 10.8 g/dL — AB (ref 12.0–16.0)
LYMPHS ABS: 1.2 10*3/uL (ref 1.0–3.6)
Lymphocyte %: 12 %
MCH: 31.2 pg (ref 26.0–34.0)
MCHC: 32.3 g/dL (ref 32.0–36.0)
MCV: 96 fL (ref 80–100)
Monocyte #: 0.6 x10 3/mm (ref 0.2–0.9)
Monocyte %: 5.9 %
NEUTROS PCT: 80.8 %
Neutrophil #: 8.2 10*3/uL — ABNORMAL HIGH (ref 1.4–6.5)
Platelet: 198 10*3/uL (ref 150–440)
RBC: 3.45 10*6/uL — AB (ref 3.80–5.20)
RDW: 14.4 % (ref 11.5–14.5)
WBC: 10.2 10*3/uL (ref 3.6–11.0)

## 2013-06-08 LAB — TROPONIN I: Troponin-I: <0.02 ng/mL

## 2013-06-08 LAB — TSH: Thyroid Stimulating Horm: 1.29 u[IU]/mL

## 2013-06-09 LAB — COMPREHENSIVE METABOLIC PANEL
ALBUMIN: 3.2 g/dL — AB (ref 3.4–5.0)
Alkaline Phosphatase: 35 U/L — ABNORMAL LOW
Anion Gap: 5 — ABNORMAL LOW (ref 7–16)
BILIRUBIN TOTAL: 0.4 mg/dL (ref 0.2–1.0)
BUN: 21 mg/dL — AB (ref 7–18)
CHLORIDE: 107 mmol/L (ref 98–107)
CREATININE: 1.46 mg/dL — AB (ref 0.60–1.30)
Calcium, Total: 8.9 mg/dL (ref 8.5–10.1)
Co2: 31 mmol/L (ref 21–32)
GFR CALC AF AMER: 41 — AB
GFR CALC NON AF AMER: 35 — AB
GLUCOSE: 195 mg/dL — AB (ref 65–99)
Osmolality: 293 (ref 275–301)
Potassium: 3.5 mmol/L (ref 3.5–5.1)
SGOT(AST): 34 U/L (ref 15–37)
SGPT (ALT): 23 U/L (ref 12–78)
Sodium: 143 mmol/L (ref 136–145)
Total Protein: 6.2 g/dL — ABNORMAL LOW (ref 6.4–8.2)

## 2013-06-09 LAB — CBC
HCT: 31.2 % — ABNORMAL LOW (ref 35.0–47.0)
HGB: 10.3 g/dL — ABNORMAL LOW (ref 12.0–16.0)
MCH: 31.2 pg (ref 26.0–34.0)
MCHC: 32.9 g/dL (ref 32.0–36.0)
MCV: 95 fL (ref 80–100)
Platelet: 192 10*3/uL (ref 150–440)
RBC: 3.29 10*6/uL — ABNORMAL LOW (ref 3.80–5.20)
RDW: 14.2 % (ref 11.5–14.5)
WBC: 8.3 10*3/uL (ref 3.6–11.0)

## 2013-06-10 ENCOUNTER — Inpatient Hospital Stay: Payer: Self-pay | Admitting: Internal Medicine

## 2013-06-10 LAB — DIFFERENTIAL
BASOS ABS: 0.1 10*3/uL (ref 0.0–0.1)
Basophil %: 0.9 %
EOS ABS: 0.1 10*3/uL (ref 0.0–0.7)
EOS PCT: 1.1 %
LYMPHS ABS: 1.4 10*3/uL (ref 1.0–3.6)
Lymphocyte %: 17.3 %
MONO ABS: 0.7 x10 3/mm (ref 0.2–0.9)
Monocyte %: 8 %
Neutrophil #: 6 10*3/uL (ref 1.4–6.5)
Neutrophil %: 72.7 %

## 2013-06-10 LAB — URINALYSIS, COMPLETE
BILIRUBIN, UR: NEGATIVE
BLOOD: NEGATIVE
Glucose,UR: NEGATIVE mg/dL (ref 0–75)
Ketone: NEGATIVE
NITRITE: POSITIVE
Ph: 6 (ref 4.5–8.0)
Protein: NEGATIVE
RBC,UR: 10 /HPF (ref 0–5)
SPECIFIC GRAVITY: 1.013 (ref 1.003–1.030)
Squamous Epithelial: NONE SEEN
WBC UR: 25 /HPF (ref 0–5)

## 2013-06-10 LAB — PROTIME-INR
INR: 2.7
PROTHROMBIN TIME: 28 s — AB (ref 11.5–14.7)

## 2013-06-11 LAB — BASIC METABOLIC PANEL
Anion Gap: 7 (ref 7–16)
BUN: 14 mg/dL (ref 7–18)
CALCIUM: 8.1 mg/dL — AB (ref 8.5–10.1)
CHLORIDE: 111 mmol/L — AB (ref 98–107)
Co2: 28 mmol/L (ref 21–32)
Creatinine: 1 mg/dL (ref 0.60–1.30)
EGFR (African American): >60
EGFR (Non-African Amer.): 56 — ABNORMAL LOW
Glucose: 96 mg/dL (ref 65–99)
Osmolality: 291 (ref 275–301)
Potassium: 3.5 mmol/L (ref 3.5–5.1)
SODIUM: 146 mmol/L — AB (ref 136–145)

## 2013-06-11 LAB — URINE CULTURE

## 2013-06-11 LAB — PROTIME-INR
INR: 3.1
Prothrombin Time: 31.2 secs — ABNORMAL HIGH (ref 11.5–14.7)

## 2013-06-12 LAB — PROTIME-INR
INR: 3.3
PROTHROMBIN TIME: 32.8 s — AB (ref 11.5–14.7)

## 2013-06-13 LAB — BASIC METABOLIC PANEL
ANION GAP: 8 (ref 7–16)
BUN: 11 mg/dL (ref 7–18)
CALCIUM: 9 mg/dL (ref 8.5–10.1)
CREATININE: 0.83 mg/dL (ref 0.60–1.30)
Chloride: 112 mmol/L — ABNORMAL HIGH (ref 98–107)
Co2: 25 mmol/L (ref 21–32)
EGFR (African American): >60
EGFR (Non-African Amer.): >60
GLUCOSE: 119 mg/dL — AB (ref 65–99)
OSMOLALITY: 289 (ref 275–301)
Potassium: 3.4 mmol/L — ABNORMAL LOW (ref 3.5–5.1)
Sodium: 145 mmol/L (ref 136–145)

## 2013-06-13 LAB — CBC WITH DIFFERENTIAL/PLATELET
Basophil #: 0 10*3/uL (ref 0.0–0.1)
Basophil %: 0.6 %
Eosinophil #: 0.1 10*3/uL (ref 0.0–0.7)
Eosinophil %: 1.3 %
HCT: 30 % — ABNORMAL LOW (ref 35.0–47.0)
HGB: 9.8 g/dL — AB (ref 12.0–16.0)
LYMPHS ABS: 1.3 10*3/uL (ref 1.0–3.6)
Lymphocyte %: 20 %
MCH: 30.9 pg (ref 26.0–34.0)
MCHC: 32.7 g/dL (ref 32.0–36.0)
MCV: 94 fL (ref 80–100)
Monocyte #: 0.4 x10 3/mm (ref 0.2–0.9)
Monocyte %: 6.5 %
NEUTROS ABS: 4.5 10*3/uL (ref 1.4–6.5)
Neutrophil %: 71.6 %
Platelet: 184 10*3/uL (ref 150–440)
RBC: 3.19 10*6/uL — ABNORMAL LOW (ref 3.80–5.20)
RDW: 13.5 % (ref 11.5–14.5)
WBC: 6.3 10*3/uL (ref 3.6–11.0)

## 2013-06-13 LAB — PROTIME-INR
INR: 3
Prothrombin Time: 30.2 secs — ABNORMAL HIGH (ref 11.5–14.7)

## 2013-06-14 LAB — PROTIME-INR
INR: 2.8
PROTHROMBIN TIME: 28.7 s — AB (ref 11.5–14.7)

## 2013-06-14 LAB — CULTURE, BLOOD (SINGLE)

## 2014-04-08 ENCOUNTER — Other Ambulatory Visit: Payer: Self-pay | Admitting: Neurosurgery

## 2014-04-13 NOTE — H&P (Signed)
Patient ID:   [id number] Patient: Evelyn Washington  Date of Birth: August 01, 1940 Visit Type: Office Visit   Date: 04/08/2014 11:30 AM Provider: Marchia Meiers. Vertell Limber MD   This 74 year old female presents for back pain and Leg pain.  History of Present Illness: 1.  back pain  2.  Leg pain  Evelyn Washington 74 year old retired female visits to discuss replacement of her spinal cord stimulator battery.  Teresa Pelton with [employer] has interrogated her implanted pulse generator and found it to be at "end of life", with such low battery that impedances could not be checked.  However, the patient's report of good pain control until two months ago indicates good electrode coverage.  She reports she never adjusted the stimulator.  She noticed her back pain had recurred and thought of her stimulator after a UTI had been ruled out.  The older model Synergy battery has lasted seven years and she wishes to continue with a nonrechargeable, Prime.  Coumadin 6.5mg  daily   History: HTN, IDDM, acute renal failure (2000) ovarian cancer, melanoma, arthritis, ?Lupus indicator found in labs. Surgical history: Spinal cord stimulator placed 1999, IPG changes'01,'04,'08; right total knee replacement 2009; left total shoulder replacement 2012 [Numerous surgeries spanning years between 1966 and 1999 scanned into note]        PAST MEDICAL/SURGICAL HISTORY   (Detailed)     PAST MEDICAL HISTORY, SURGICAL HISTORY, FAMILY HISTORY, SOCIAL HISTORY AND REVIEW OF SYSTEMS I have reviewed the patient's past medical, surgical, family and social history as well as the comprehensive review of systems as included on the [facility] history form dated 04/08/2014, which I have signed.  Family History  (Detailed) Relationship Family Member Name Deceased Age at Death Condition Onset Age Cause of Death      Family history of Diabetes mellitus  N      Family history of Arthritis  N      Family history of  Congestive heart failure  N    SOCIAL HISTORY  (Detailed) Tobacco use reviewed. Preferred language is Unknown.   Smoking status: Never smoker.  SMOKING STATUS Use Status Type Smoking Status Usage Per Day Years Used Total Pack Years  no/never  Never smoker             MEDICATIONS(added, continued or stopped this visit):   ALLERGIES: Ingredient Reaction Medication Name Comment  ONDANSETRON HCL Seizures Zofran (as hydrochloride)    Reviewed, updated.   REVIEW OF SYSTEMS System Neg/Pos Details  Constitutional Negative Chills, fatigue, fever, malaise, night sweats, weight gain and weight loss.  ENMT Negative Ear drainage, hearing loss, nasal drainage, otalgia, sinus pressure and sore throat.  Eyes Negative Eye discharge, eye pain and vision changes.  Respiratory Negative Chronic cough, cough, dyspnea, known TB exposure and wheezing.  Cardio Positive Edema.  GI Negative Abdominal pain, blood in stool, change in stool pattern, constipation, decreased appetite, diarrhea, heartburn, nausea and vomiting.  GU Negative Dysuria, hematuria, hot flashes, irregular menses, polyuria, urinary frequency, urinary incontinence and urinary retention.  Endocrine Negative Cold intolerance, heat intolerance, polydipsia and polyphagia.  Neuro Negative Dizziness, extremity weakness, gait disturbance, headache, memory impairment, numbness in extremity, seizures and tremors.  Psych Negative Anxiety, depression and insomnia.  Integumentary Negative Brittle hair, brittle nails, change in shape/size of mole(s), hair loss, hirsutism, hives, pruritus, rash and skin lesion.  MS Negative Back pain, joint pain, joint swelling, muscle weakness and neck pain.  Hema/Lymph Negative Easy bleeding, easy bruising and lymphadenopathy.  Allergic/Immuno  Negative Contact allergy, environmental allergies, food allergies and seasonal allergies.  Reproductive Negative Breast discharge, breast lump(s), dysmenorrhea,  dyspareunia, history of abnormal PAP smear and vaginal discharge.     Vitals Date Temp F BP Pulse Ht In Wt Lb BMI BSA Pain Score  04/08/2014  129/70 82 59.5 230 45.68  9/10     PHYSICAL EXAM General Level of Distress: no acute distress Overall Appearance: obese    Cardiovascular Cardiac: regular rate and rhythm without murmur  Respiratory Lungs: clear to auscultation  Neurological Recent and Remote Memory: normal Attention Span and Concentration:   normal Language: normal Fund of Knowledge: normal  Right Left Sensation: normal normal Upper Extremity Coordination: normal normal  Lower Extremity Coordination: normal normal  Musculoskeletal Gait and Station: normal  Right Left Upper Extremity Muscle Strength: normal normal Lower Extremity Muscle Strength: normal normal Upper Extremity Muscle Tone:  normal normal Lower Extremity Muscle Tone: normal normal  Motor Strength Upper and lower extremity motor strength was tested in the clinically pertinent muscles.     Deep Tendon Reflexes  Right Left Biceps: normal normal Triceps: normal normal Brachiloradialis: normal normal Patellar: normal normal Achilles: normal normal  Sensory Sensation was tested at L1 to S1.   Cranial Nerves II. Optic Nerve/Visual Fields: normal III. Oculomotor: normal IV. Trochlear: normal V. Trigeminal: normal VI. Abducens: normal VII. Facial: normal VIII. Acoustic/Vestibular: normal IX. Glossopharyngeal: normal X. Vagus: normal XI. Spinal Accessory: normal XII. Hypoglossal: normal  Motor and other Tests Lhermittes: negative Rhomberg: negative    Right Left Hoffman's: normal normal Clonus: normal normal Babinski: normal normal SLR: negative negative Patrick's Corky Sox): negative negative Toe Walk: normal normal Toe Lift: normal normal Heel Walk: normal normal SI Joint: nontender nontender   Additional Findings:  Patient is in a wheelchair.  She has lateral lower  extremity edema.  She has a healed left lower quadrant abdominal incision with implanted pulse generator at this level.  Her thoracic spine was assessed and she has a healed midline incision where this spinal cord stimulator electrode and extension were placed.  While patient is in a wheelchair, she states that she is able to walk with a walker.   DIAGNOSTIC RESULTS I reviewed lumbar imaging as well as thoracic imaging which demonstrated a 1 x 4-lead at the T8 T9 level and left lower quadrant abdominal implantable pulse generator for spinal cord stimulator.    IMPRESSION Patient is depleted spinal cord stimulator pulse generator and wishes to have this revised.  She has had significant improvement in her pain complaints with this device.  Completed Orders (this encounter) Order Details Reason Side Interpretation Result Initial Treatment Date Region  Lifestyle education regarding diet Encouraged to eat a well balanced diet and follow up with primary care physician.         Assessment/Plan # Detail Type Description   1. 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       2. Assessment Scoliosis (and kyphoscoliosis), idiopathic (M41.20).       3. Assessment Idiopathic scoliosis of lumbar region (M41.26).       4. Assessment Low back pain, unspecified back pain laterality, with sciatica presence unspecified (M54.5).       5. Assessment Body mass index (BMI) 45.0-49.9, adult (Z68.42).   Plan Orders Today's instructions / counseling include(s) Lifestyle education regarding diet.         Pain Assessment/Treatment Pain Scale: 9/10. Method: Numeric Pain Intensity Scale. Location: back/leg/arm. Onset: 01/06/2014. Duration: varies. Quality: discomforting.  Pain Assessment/Treatment follow-up plan of care: Patient is taking medications as prescribed..  Implantable pulse generator revision scheduled for 04/16/14.  Risks and benefits were discussed with  the patient.  She is advised to come off Coumadin 6 days prior to surgery.  Orders: Diagnostic Procedures: Assessment Procedure  M54.5 spinal cord stimulator ipg revision  Instruction(s)/Education: Assessment Instruction  Z68.42 Lifestyle education regarding diet             Provider:  Marchia Meiers. Vertell Limber MD  04/08/2014 05:50 PM Dictation edited by: Marchia Meiers. Vertell Limber    CC Providers: Erline Levine MD [id number] [place], [place] [id number]              Electronically signed by Marchia Meiers. Vertell Limber MD on 04/08/2014 05:51 PM

## 2014-04-15 ENCOUNTER — Encounter (HOSPITAL_COMMUNITY): Payer: Self-pay | Admitting: *Deleted

## 2014-04-15 MED ORDER — MUPIROCIN 2 % EX OINT
1.0000 "application " | TOPICAL_OINTMENT | Freq: Once | CUTANEOUS | Status: AC
  Administered 2014-04-16: 1 via TOPICAL
  Filled 2014-04-15: qty 22

## 2014-04-15 MED ORDER — CEFAZOLIN SODIUM-DEXTROSE 2-3 GM-% IV SOLR
2.0000 g | INTRAVENOUS | Status: AC
  Administered 2014-04-16: 2 g via INTRAVENOUS
  Filled 2014-04-15: qty 50

## 2014-04-15 NOTE — Progress Notes (Signed)
SDW pre-op call completed by pt nurse Johnney Ou at [facility] facility. Nurse denies pt having SOB, chest pain and being under the care of a cardiologist. Nurse denies that the pt had a chest x ray or EKG w/i the last year. Nurse denies pt having a stress test, echo and cardiac cath. Nurse stated that pt last dose of Coumadin was 04/10/14. Nurse made aware to stop administering  Aspirin, Coumadin, vitamins, and herbal medications. Do not take any NSAIDs ie: Ibuprofen, Advil, Naproxen or any medication containing Aspirin.  Nurse made aware to not administer any diabetic medication on the DOS. Please assess for anesthesia complications and cardiac studies on DOS.

## 2014-04-16 ENCOUNTER — Observation Stay (HOSPITAL_COMMUNITY)
Admission: RE | Admit: 2014-04-16 | Discharge: 2014-04-17 | Disposition: A | Discharge status: Skilled Nursing Facility | Payer: Medicare Other | Source: Ambulatory Visit | Attending: Neurosurgery | Admitting: Neurosurgery

## 2014-04-16 ENCOUNTER — Encounter (HOSPITAL_COMMUNITY): Payer: Self-pay | Admitting: *Deleted

## 2014-04-16 ENCOUNTER — Encounter (HOSPITAL_COMMUNITY)
Admission: RE | Disposition: A | Discharge status: Skilled Nursing Facility | Payer: Self-pay | Source: Ambulatory Visit | Attending: Neurosurgery

## 2014-04-16 ENCOUNTER — Ambulatory Visit (HOSPITAL_COMMUNITY): Payer: Medicare Other | Admitting: Certified Registered Nurse Anesthetist

## 2014-04-16 DIAGNOSIS — R569 Unspecified convulsions: Secondary | ICD-10-CM | POA: Insufficient documentation

## 2014-04-16 DIAGNOSIS — F418 Other specified anxiety disorders: Secondary | ICD-10-CM | POA: Diagnosis not present

## 2014-04-16 DIAGNOSIS — E119 Type 2 diabetes mellitus without complications: Secondary | ICD-10-CM | POA: Diagnosis not present

## 2014-04-16 DIAGNOSIS — G4730 Sleep apnea, unspecified: Secondary | ICD-10-CM | POA: Insufficient documentation

## 2014-04-16 DIAGNOSIS — I10 Essential (primary) hypertension: Secondary | ICD-10-CM | POA: Insufficient documentation

## 2014-04-16 DIAGNOSIS — M47816 Spondylosis without myelopathy or radiculopathy, lumbar region: Principal | ICD-10-CM | POA: Insufficient documentation

## 2014-04-16 DIAGNOSIS — Z794 Long term (current) use of insulin: Secondary | ICD-10-CM | POA: Diagnosis not present

## 2014-04-16 DIAGNOSIS — G8929 Other chronic pain: Secondary | ICD-10-CM | POA: Diagnosis present

## 2014-04-16 DIAGNOSIS — M4120 Other idiopathic scoliosis, site unspecified: Secondary | ICD-10-CM | POA: Diagnosis not present

## 2014-04-16 HISTORY — DX: Dorsalgia, unspecified: M54.9

## 2014-04-16 HISTORY — PX: SPINAL CORD STIMULATOR BATTERY EXCHANGE: SHX6202

## 2014-04-16 HISTORY — DX: Hyperlipidemia, unspecified: E78.5

## 2014-04-16 HISTORY — DX: Unspecified hearing loss, unspecified ear: H91.90

## 2014-04-16 HISTORY — DX: Muscle weakness (generalized): M62.81

## 2014-04-16 HISTORY — DX: Metabolic encephalopathy: G93.41

## 2014-04-16 HISTORY — DX: Restless legs syndrome: G25.81

## 2014-04-16 HISTORY — DX: Bipolar disorder, unspecified: F31.9

## 2014-04-16 HISTORY — DX: Anxiety disorder, unspecified: F41.9

## 2014-04-16 HISTORY — DX: Other chronic pain: G89.29

## 2014-04-16 HISTORY — DX: Urinary tract infection, site not specified: N39.0

## 2014-04-16 HISTORY — DX: Constipation, unspecified: K59.00

## 2014-04-16 LAB — BASIC METABOLIC PANEL
Anion gap: 10 (ref 5–15)
BUN: 17 mg/dL (ref 6–23)
CALCIUM: 9.1 mg/dL (ref 8.4–10.5)
CHLORIDE: 108 mmol/L (ref 96–112)
CO2: 25 mmol/L (ref 19–32)
CREATININE: 1.06 mg/dL (ref 0.50–1.10)
GFR calc Af Amer: 59 mL/min — ABNORMAL LOW (ref >90)
GFR calc non Af Amer: 51 mL/min — ABNORMAL LOW (ref >90)
Glucose, Bld: 209 mg/dL — ABNORMAL HIGH (ref 70–99)
Potassium: 3.8 mmol/L (ref 3.5–5.1)
Sodium: 143 mmol/L (ref 135–145)

## 2014-04-16 LAB — SURGICAL PCR SCREEN
MRSA, PCR: POSITIVE — AB
Staphylococcus aureus: POSITIVE — AB

## 2014-04-16 LAB — PROTIME-INR
INR: 1.14 (ref 0.00–1.49)
Prothrombin Time: 14.7 seconds (ref 11.6–15.2)

## 2014-04-16 LAB — CBC
HEMATOCRIT: 36.9 % (ref 36.0–46.0)
HEMOGLOBIN: 11.7 g/dL — AB (ref 12.0–15.0)
MCH: 29.4 pg (ref 26.0–34.0)
MCHC: 31.7 g/dL (ref 30.0–36.0)
MCV: 92.7 fL (ref 78.0–100.0)
Platelets: 188 10*3/uL (ref 150–400)
RBC: 3.98 MIL/uL (ref 3.87–5.11)
RDW: 14.2 % (ref 11.5–15.5)
WBC: 6.9 10*3/uL (ref 4.0–10.5)

## 2014-04-16 LAB — GLUCOSE, CAPILLARY
GLUCOSE-CAPILLARY: 159 mg/dL — AB (ref 70–99)
Glucose-Capillary: 194 mg/dL — ABNORMAL HIGH (ref 70–99)
Glucose-Capillary: 258 mg/dL — ABNORMAL HIGH (ref 70–99)

## 2014-04-16 LAB — APTT: APTT: 28 s (ref 24–37)

## 2014-04-16 SURGERY — SPINAL CORD STIMULATOR BATTERY EXCHANGE
Anesthesia: General

## 2014-04-16 MED ORDER — MENTHOL 3 MG MT LOZG: 1.0000 | LOZENGE | OROMUCOSAL | Status: DC | PRN

## 2014-04-16 MED ORDER — HYDROCODONE-ACETAMINOPHEN 5-325 MG PO TABS
1.0000 | ORAL_TABLET | ORAL | Status: DC | PRN
  Administered 2014-04-17: 2 via ORAL
  Administered 2014-04-17: 1 via ORAL
  Filled 2014-04-16: qty 2
  Filled 2014-04-16: qty 1

## 2014-04-16 MED ORDER — DOCUSATE SODIUM 100 MG PO CAPS
100.0000 mg | ORAL_CAPSULE | Freq: Two times a day (BID) | ORAL | Status: DC
  Administered 2014-04-17: 100 mg via ORAL
  Filled 2014-04-16: qty 1

## 2014-04-16 MED ORDER — PHENOL 1.4 % MT LIQD: 1.0000 | OROMUCOSAL | Status: DC | PRN

## 2014-04-16 MED ORDER — ACETAMINOPHEN 325 MG PO TABS: 650.0000 mg | ORAL_TABLET | ORAL | Status: DC | PRN

## 2014-04-16 MED ORDER — LACTATED RINGERS IV SOLN
INTRAVENOUS | Status: DC | PRN
  Administered 2014-04-16: 16:00:00 via INTRAVENOUS

## 2014-04-16 MED ORDER — SODIUM CHLORIDE 0.9 % IJ SOLN
3.0000 mL | Freq: Two times a day (BID) | INTRAMUSCULAR | Status: DC
  Administered 2014-04-16 – 2014-04-17 (×2): 3 mL via INTRAVENOUS

## 2014-04-16 MED ORDER — PROPOFOL 10 MG/ML IV BOLUS
INTRAVENOUS | Status: DC | PRN
  Administered 2014-04-16: 150 mg via INTRAVENOUS

## 2014-04-16 MED ORDER — METHOCARBAMOL 1000 MG/10ML IJ SOLN
500.0000 mg | Freq: Four times a day (QID) | INTRAVENOUS | Status: DC | PRN
  Filled 2014-04-16: qty 5

## 2014-04-16 MED ORDER — TRAMADOL HCL 50 MG PO TABS
50.0000 mg | ORAL_TABLET | Freq: Four times a day (QID) | ORAL | Status: DC | PRN
  Administered 2014-04-17: 50 mg via ORAL
  Filled 2014-04-16: qty 1

## 2014-04-16 MED ORDER — SENNA 8.6 MG PO TABS
1.0000 | ORAL_TABLET | Freq: Two times a day (BID) | ORAL | Status: DC
  Administered 2014-04-17: 8.6 mg via ORAL
  Filled 2014-04-16: qty 1

## 2014-04-16 MED ORDER — METHOCARBAMOL 500 MG PO TABS: 500.0000 mg | ORAL_TABLET | Freq: Four times a day (QID) | ORAL | Status: DC | PRN

## 2014-04-16 MED ORDER — RISPERIDONE 1 MG PO TBDP
1.0000 mg | ORAL_TABLET | Freq: Every day | ORAL | Status: DC
  Administered 2014-04-16: 1 mg via ORAL
  Filled 2014-04-16 (×2): qty 1

## 2014-04-16 MED ORDER — SODIUM CHLORIDE 0.9 % IV SOLN: 250.0000 mL | INTRAVENOUS | Status: DC

## 2014-04-16 MED ORDER — CITALOPRAM HYDROBROMIDE 10 MG PO TABS: 20.0000 mg | ORAL_TABLET | Freq: Every day | ORAL | Status: DC

## 2014-04-16 MED ORDER — WARFARIN 0.5 MG HALF TABLET: 0.5000 mg | ORAL_TABLET | Freq: Every day | ORAL | Status: DC

## 2014-04-16 MED ORDER — PROMETHAZINE HCL 25 MG PO TABS: 25.0000 mg | ORAL_TABLET | Freq: Four times a day (QID) | ORAL | Status: DC | PRN

## 2014-04-16 MED ORDER — PROPOFOL 10 MG/ML IV BOLUS
INTRAVENOUS | Status: AC
  Filled 2014-04-16: qty 20

## 2014-04-16 MED ORDER — VANCOMYCIN HCL IN DEXTROSE 1-5 GM/200ML-% IV SOLN
INTRAVENOUS | Status: AC
  Filled 2014-04-16: qty 200

## 2014-04-16 MED ORDER — FUROSEMIDE 40 MG PO TABS
40.0000 mg | ORAL_TABLET | Freq: Every day | ORAL | Status: DC
  Administered 2014-04-17: 40 mg via ORAL
  Filled 2014-04-16: qty 1

## 2014-04-16 MED ORDER — ENOXAPARIN SODIUM 30 MG/0.3ML ~~LOC~~ SOLN: 30.0000 mg | Freq: Two times a day (BID) | SUBCUTANEOUS | Status: DC

## 2014-04-16 MED ORDER — DORZOLAMIDE HCL 2 % OP SOLN
1.0000 [drp] | Freq: Three times a day (TID) | OPHTHALMIC | Status: DC
  Administered 2014-04-16: 1 [drp] via OPHTHALMIC
  Filled 2014-04-16 (×2): qty 10

## 2014-04-16 MED ORDER — SODIUM CHLORIDE 0.9 % IJ SOLN: 3.0000 mL | INTRAMUSCULAR | Status: DC | PRN

## 2014-04-16 MED ORDER — ACETAMINOPHEN 650 MG RE SUPP: 650.0000 mg | RECTAL | Status: DC | PRN

## 2014-04-16 MED ORDER — FENTANYL CITRATE 0.05 MG/ML IJ SOLN
INTRAMUSCULAR | Status: DC | PRN
  Administered 2014-04-16: 125 ug via INTRAVENOUS

## 2014-04-16 MED ORDER — LACTATED RINGERS IV SOLN
INTRAVENOUS | Status: DC
  Administered 2014-04-16: 14:00:00 via INTRAVENOUS

## 2014-04-16 MED ORDER — MIDAZOLAM HCL 2 MG/2ML IJ SOLN
INTRAMUSCULAR | Status: AC
Start: 2014-04-16 — End: 2014-04-16
  Filled 2014-04-16: qty 2

## 2014-04-16 MED ORDER — HYDROMORPHONE HCL 1 MG/ML IJ SOLN: 0.5000 mg | INTRAMUSCULAR | Status: DC | PRN

## 2014-04-16 MED ORDER — PANTOPRAZOLE SODIUM 40 MG IV SOLR
40.0000 mg | Freq: Every day | INTRAVENOUS | Status: DC
  Administered 2014-04-16: 40 mg via INTRAVENOUS
  Filled 2014-04-16: qty 40

## 2014-04-16 MED ORDER — FENTANYL CITRATE 0.05 MG/ML IJ SOLN
INTRAMUSCULAR | Status: AC
  Filled 2014-04-16: qty 5

## 2014-04-16 MED ORDER — GLIMEPIRIDE 4 MG PO TABS
2.0000 mg | ORAL_TABLET | Freq: Every day | ORAL | Status: DC
  Administered 2014-04-17: 2 mg via ORAL
  Filled 2014-04-16: qty 1

## 2014-04-16 MED ORDER — ALUM & MAG HYDROXIDE-SIMETH 200-200-20 MG/5ML PO SUSP: 30.0000 mL | Freq: Four times a day (QID) | ORAL | Status: DC | PRN

## 2014-04-16 MED ORDER — VANCOMYCIN HCL 1000 MG IV SOLR
INTRAVENOUS | Status: DC | PRN
  Administered 2014-04-16: 1000 mg via TOPICAL

## 2014-04-16 MED ORDER — KCL IN DEXTROSE-NACL 20-5-0.45 MEQ/L-%-% IV SOLN
INTRAVENOUS | Status: DC
  Administered 2014-04-16: 21:00:00 via INTRAVENOUS
  Filled 2014-04-16: qty 1000

## 2014-04-16 MED ORDER — LIDOCAINE HCL (CARDIAC) 20 MG/ML IV SOLN
INTRAVENOUS | Status: AC
  Filled 2014-04-16: qty 5

## 2014-04-16 MED ORDER — FENOFIBRATE 54 MG PO TABS
54.0000 mg | ORAL_TABLET | Freq: Every day | ORAL | Status: DC
  Administered 2014-04-16 – 2014-04-17 (×2): 54 mg via ORAL
  Filled 2014-04-16 (×2): qty 1

## 2014-04-16 MED ORDER — ALPRAZOLAM 0.25 MG PO TABS
0.2500 mg | ORAL_TABLET | Freq: Three times a day (TID) | ORAL | Status: DC | PRN
  Administered 2014-04-17: 0.25 mg via ORAL
  Filled 2014-04-16: qty 1

## 2014-04-16 MED ORDER — GABAPENTIN 300 MG PO CAPS
300.0000 mg | ORAL_CAPSULE | Freq: Three times a day (TID) | ORAL | Status: DC
  Administered 2014-04-16 – 2014-04-17 (×3): 300 mg via ORAL
  Filled 2014-04-16 (×3): qty 1

## 2014-04-16 MED ORDER — PHENYLEPHRINE HCL 10 MG/ML IJ SOLN
INTRAMUSCULAR | Status: DC | PRN
  Administered 2014-04-16: 80 ug via INTRAVENOUS
  Administered 2014-04-16: 120 ug via INTRAVENOUS

## 2014-04-16 MED ORDER — INSULIN ASPART 100 UNIT/ML ~~LOC~~ SOLN
0.0000 [IU] | Freq: Every day | SUBCUTANEOUS | Status: DC
  Administered 2014-04-16: 3 [IU] via SUBCUTANEOUS

## 2014-04-16 MED ORDER — INSULIN DETEMIR 100 UNIT/ML ~~LOC~~ SOLN
42.0000 [IU] | Freq: Every day | SUBCUTANEOUS | Status: DC
  Administered 2014-04-16: 42 [IU] via SUBCUTANEOUS
  Filled 2014-04-16 (×2): qty 0.42

## 2014-04-16 MED ORDER — HYDROCODONE-ACETAMINOPHEN 5-325 MG PO TABS: 1.0000 | ORAL_TABLET | Freq: Three times a day (TID) | ORAL | Status: DC | PRN

## 2014-04-16 MED ORDER — SUCCINYLCHOLINE CHLORIDE 20 MG/ML IJ SOLN
INTRAMUSCULAR | Status: DC | PRN
  Administered 2014-04-16: 110 mg via INTRAVENOUS

## 2014-04-16 MED ORDER — FENTANYL CITRATE 0.05 MG/ML IJ SOLN: 25.0000 ug | INTRAMUSCULAR | Status: DC | PRN

## 2014-04-16 MED ORDER — SALINE SPRAY 0.65 % NA SOLN
1.0000 | Freq: Three times a day (TID) | NASAL | Status: DC | PRN
  Filled 2014-04-16: qty 44

## 2014-04-16 MED ORDER — PROPRANOLOL HCL 80 MG PO TABS
80.0000 mg | ORAL_TABLET | Freq: Two times a day (BID) | ORAL | Status: DC
  Administered 2014-04-16 – 2014-04-17 (×2): 80 mg via ORAL
  Filled 2014-04-16 (×3): qty 1

## 2014-04-16 MED ORDER — BUPIVACAINE HCL (PF) 0.5 % IJ SOLN
INTRAMUSCULAR | Status: DC | PRN
  Administered 2014-04-16: 20 mL

## 2014-04-16 MED ORDER — PHENYLEPHRINE 40 MCG/ML (10ML) SYRINGE FOR IV PUSH (FOR BLOOD PRESSURE SUPPORT)
PREFILLED_SYRINGE | INTRAVENOUS | Status: AC
  Filled 2014-04-16: qty 10

## 2014-04-16 MED ORDER — ARTIFICIAL TEARS OP OINT
TOPICAL_OINTMENT | OPHTHALMIC | Status: AC
  Filled 2014-04-16: qty 3.5

## 2014-04-16 MED ORDER — PHENYLEPHRINE HCL 10 MG/ML IJ SOLN
INTRAMUSCULAR | Status: AC
  Filled 2014-04-16: qty 1

## 2014-04-16 MED ORDER — ARIPIPRAZOLE 2 MG PO TABS
2.0000 mg | ORAL_TABLET | Freq: Every day | ORAL | Status: DC
  Administered 2014-04-17: 2 mg via ORAL
  Filled 2014-04-16: qty 1

## 2014-04-16 MED ORDER — METFORMIN HCL 500 MG PO TABS
1000.0000 mg | ORAL_TABLET | Freq: Two times a day (BID) | ORAL | Status: DC
  Administered 2014-04-17 (×2): 1000 mg via ORAL
  Filled 2014-04-16 (×2): qty 2

## 2014-04-16 MED ORDER — LISINOPRIL 5 MG PO TABS
5.0000 mg | ORAL_TABLET | Freq: Every day | ORAL | Status: DC
  Administered 2014-04-17: 5 mg via ORAL
  Filled 2014-04-16: qty 1

## 2014-04-16 MED ORDER — SIMVASTATIN 40 MG PO TABS
40.0000 mg | ORAL_TABLET | Freq: Every evening | ORAL | Status: DC
Start: 2014-04-16 — End: 2014-04-17
  Administered 2014-04-16 – 2014-04-17 (×2): 40 mg via ORAL
  Filled 2014-04-16 (×2): qty 1

## 2014-04-16 MED ORDER — CEFAZOLIN SODIUM 1-5 GM-% IV SOLN
1.0000 g | Freq: Three times a day (TID) | INTRAVENOUS | Status: AC
  Administered 2014-04-17 (×2): 1 g via INTRAVENOUS
  Filled 2014-04-16 (×3): qty 50

## 2014-04-16 MED ORDER — PHENYLEPHRINE HCL 10 MG/ML IJ SOLN
10.0000 mg | INTRAVENOUS | Status: DC | PRN
  Administered 2014-04-16: 40 ug/min via INTRAVENOUS

## 2014-04-16 MED ORDER — LAMOTRIGINE 100 MG PO TABS
150.0000 mg | ORAL_TABLET | Freq: Every day | ORAL | Status: DC
  Administered 2014-04-16: 150 mg via ORAL
  Filled 2014-04-16: qty 2

## 2014-04-16 MED ORDER — INSULIN ASPART 100 UNIT/ML ~~LOC~~ SOLN
0.0000 [IU] | Freq: Three times a day (TID) | SUBCUTANEOUS | Status: DC
  Administered 2014-04-17 (×2): 5 [IU] via SUBCUTANEOUS
  Administered 2014-04-17: 3 [IU] via SUBCUTANEOUS

## 2014-04-16 MED ORDER — OXYCODONE-ACETAMINOPHEN 5-325 MG PO TABS
0.5000 | ORAL_TABLET | Freq: Three times a day (TID) | ORAL | Status: DC
  Administered 2014-04-16 – 2014-04-17 (×3): 0.5 via ORAL
  Filled 2014-04-16 (×3): qty 1

## 2014-04-16 MED ORDER — LIDOCAINE HCL (CARDIAC) 20 MG/ML IV SOLN
INTRAVENOUS | Status: DC | PRN
  Administered 2014-04-16: 60 mg via INTRAVENOUS

## 2014-04-16 MED ORDER — ONDANSETRON HCL 4 MG/2ML IJ SOLN: 4.0000 mg | INTRAMUSCULAR | Status: DC | PRN

## 2014-04-16 MED ORDER — METHOCARBAMOL 750 MG PO TABS: 750.0000 mg | ORAL_TABLET | Freq: Four times a day (QID) | ORAL | Status: DC | PRN

## 2014-04-16 MED ORDER — DOCUSATE SODIUM 100 MG PO CAPS: 100.0000 mg | ORAL_CAPSULE | Freq: Two times a day (BID) | ORAL | Status: DC | PRN

## 2014-04-16 MED ORDER — ESCITALOPRAM OXALATE 10 MG PO TABS
10.0000 mg | ORAL_TABLET | Freq: Every day | ORAL | Status: DC
  Administered 2014-04-17: 10 mg via ORAL
  Filled 2014-04-16: qty 1

## 2014-04-16 MED ORDER — LIDOCAINE-EPINEPHRINE 1 %-1:100000 IJ SOLN
INTRAMUSCULAR | Status: DC | PRN
  Administered 2014-04-16: 20 mL

## 2014-04-16 MED ORDER — INSULIN ASPART 100 UNIT/ML ~~LOC~~ SOLN
4.0000 [IU] | Freq: Three times a day (TID) | SUBCUTANEOUS | Status: DC
Start: 2014-04-17 — End: 2014-04-17
  Administered 2014-04-17: 4 [IU] via SUBCUTANEOUS

## 2014-04-16 MED ORDER — GABAPENTIN 600 MG PO TABS: 300.0000 mg | ORAL_TABLET | Freq: Three times a day (TID) | ORAL | Status: DC

## 2014-04-16 MED ORDER — LAMOTRIGINE 100 MG PO TABS
100.0000 mg | ORAL_TABLET | Freq: Every morning | ORAL | Status: DC
  Administered 2014-04-17: 100 mg via ORAL
  Filled 2014-04-16: qty 1

## 2014-04-16 MED ORDER — ARTIFICIAL TEARS OP OINT
TOPICAL_OINTMENT | OPHTHALMIC | Status: DC | PRN
  Administered 2014-04-16: 1 via OPHTHALMIC

## 2014-04-16 MED ORDER — MIDAZOLAM HCL 5 MG/5ML IJ SOLN
INTRAMUSCULAR | Status: DC | PRN
  Administered 2014-04-16: 1 mg via INTRAVENOUS

## 2014-04-16 MED ORDER — 0.9 % SODIUM CHLORIDE (POUR BTL) OPTIME
TOPICAL | Status: DC | PRN
Start: 2014-04-16 — End: 2014-04-16
  Administered 2014-04-16: 1000 mL

## 2014-04-16 MED ORDER — VITAMIN D (ERGOCALCIFEROL) 1.25 MG (50000 UNIT) PO CAPS: 50000.0000 [IU] | ORAL_CAPSULE | ORAL | Status: DC

## 2014-04-16 SURGICAL SUPPLY — 61 items
ADAPTER POCKET 1X4 (Neuro Prosthesis/Implant) ×3 IMPLANT
BENZOIN TINCTURE PRP APPL 2/3 (GAUZE/BANDAGES/DRESSINGS) IMPLANT
BLADE CLIPPER SURG (BLADE) IMPLANT
BRUSH SCRUB EZ PLAIN DRY (MISCELLANEOUS) ×3 IMPLANT
BUR MATCHSTICK NEURO 3.0 LAGG (BURR) IMPLANT
BUR PRECISION FLUTE 5.0 (BURR) IMPLANT
CLOSURE WOUND 1/2 X4 (GAUZE/BANDAGES/DRESSINGS)
CONT SPEC 4OZ CLIKSEAL STRL BL (MISCELLANEOUS) ×3 IMPLANT
DECANTER SPIKE VIAL GLASS SM (MISCELLANEOUS) ×3 IMPLANT
DERMABOND ADHESIVE PROPEN (GAUZE/BANDAGES/DRESSINGS) ×2
DERMABOND ADVANCED .7 DNX6 (GAUZE/BANDAGES/DRESSINGS) ×1 IMPLANT
DRAPE C-ARM 42X72 X-RAY (DRAPES) IMPLANT
DRAPE CAMERA VIDEO/LASER (DRAPES) ×3 IMPLANT
DRAPE INCISE IOBAN 85X60 (DRAPES) IMPLANT
DRAPE LAPAROTOMY 100X72X124 (DRAPES) ×3 IMPLANT
DRAPE POUCH INSTRU U-SHP 10X18 (DRAPES) ×3 IMPLANT
DRAPE SURG 17X23 STRL (DRAPES) ×3 IMPLANT
DRSG OPSITE POSTOP 4X6 (GAUZE/BANDAGES/DRESSINGS) ×3 IMPLANT
DRSG TELFA 3X8 NADH (GAUZE/BANDAGES/DRESSINGS) IMPLANT
ELECT REM PT RETURN 9FT ADLT (ELECTROSURGICAL) ×3
ELECTRODE REM PT RTRN 9FT ADLT (ELECTROSURGICAL) ×1 IMPLANT
GAUZE SPONGE 4X4 16PLY XRAY LF (GAUZE/BANDAGES/DRESSINGS) IMPLANT
GLOVE BIO SURGEON STRL SZ8 (GLOVE) ×3 IMPLANT
GLOVE BIOGEL PI IND STRL 8 (GLOVE) ×1 IMPLANT
GLOVE BIOGEL PI IND STRL 8.5 (GLOVE) ×1 IMPLANT
GLOVE BIOGEL PI INDICATOR 8 (GLOVE) ×2
GLOVE BIOGEL PI INDICATOR 8.5 (GLOVE) ×2
GLOVE ECLIPSE 7.5 STRL STRAW (GLOVE) ×9 IMPLANT
GLOVE EXAM NITRILE LRG STRL (GLOVE) IMPLANT
GLOVE EXAM NITRILE MD LF STRL (GLOVE) IMPLANT
GLOVE EXAM NITRILE XL STR (GLOVE) IMPLANT
GLOVE EXAM NITRILE XS STR PU (GLOVE) IMPLANT
GOWN STRL REUS W/ TWL LRG LVL3 (GOWN DISPOSABLE) IMPLANT
GOWN STRL REUS W/ TWL XL LVL3 (GOWN DISPOSABLE) ×1 IMPLANT
GOWN STRL REUS W/TWL 2XL LVL3 (GOWN DISPOSABLE) IMPLANT
GOWN STRL REUS W/TWL LRG LVL3 (GOWN DISPOSABLE)
GOWN STRL REUS W/TWL XL LVL3 (GOWN DISPOSABLE) ×2
KIT BASIN OR (CUSTOM PROCEDURE TRAY) ×3 IMPLANT
KIT ROOM TURNOVER OR (KITS) ×3 IMPLANT
LIQUID BAND (GAUZE/BANDAGES/DRESSINGS) IMPLANT
NEEDLE HYPO 25X1 1.5 SAFETY (NEEDLE) ×3 IMPLANT
NEURO GENER MRI  PRIMEADV (Neuro Prosthesis/Implant) ×2 IMPLANT
NEURO GENER MRI PRIMEADV (Neuro Prosthesis/Implant) ×1 IMPLANT
NS IRRIG 1000ML POUR BTL (IV SOLUTION) ×3 IMPLANT
PACK LAMINECTOMY NEURO (CUSTOM PROCEDURE TRAY) ×3 IMPLANT
PAD ARMBOARD 7.5X6 YLW CONV (MISCELLANEOUS) ×3 IMPLANT
PROGRAMMER PATIENT (MISCELLANEOUS) ×3 IMPLANT
SPONGE LAP 4X18 X RAY DECT (DISPOSABLE) IMPLANT
SPONGE SURGIFOAM ABS GEL SZ50 (HEMOSTASIS) ×3 IMPLANT
STAPLER SKIN PROX WIDE 3.9 (STAPLE) IMPLANT
STRIP CLOSURE SKIN 1/2X4 (GAUZE/BANDAGES/DRESSINGS) IMPLANT
SUT SILK 0 TIES 10X30 (SUTURE) IMPLANT
SUT SILK 2 0 FS (SUTURE) IMPLANT
SUT SILK 2 0 TIES 10X30 (SUTURE) IMPLANT
SUT VIC AB 0 CT1 18XCR BRD8 (SUTURE) ×1 IMPLANT
SUT VIC AB 0 CT1 8-18 (SUTURE) ×2
SUT VIC AB 2-0 CP2 18 (SUTURE) ×3 IMPLANT
SUT VIC AB 3-0 SH 8-18 (SUTURE) ×3 IMPLANT
TOWEL OR 17X24 6PK STRL BLUE (TOWEL DISPOSABLE) ×3 IMPLANT
TOWEL OR 17X26 10 PK STRL BLUE (TOWEL DISPOSABLE) ×3 IMPLANT
WATER STERILE IRR 1000ML POUR (IV SOLUTION) ×3 IMPLANT

## 2014-04-16 NOTE — Interval H&P Note (Signed)
History and Physical Interval Note:  04/16/2014 7:16 AM  Evelyn Washington  has presented today for surgery, with the diagnosis of Low back pain, unspecified; Spondylosis of lumbar region w/o myelopathy or radiculopathy  The various methods of treatment have been discussed with the patient and family. After consideration of risks, benefits and other options for treatment, the patient has consented to  Procedure(s) with comments: Spinal cord stimulator implantable pulse generator revision (N/A) - Spinal cord stimulator implantable pulse generator revision as a surgical intervention .  The patient's history has been reviewed, patient examined, no change in status, stable for surgery.  I have reviewed the patient's chart and labs.  Questions were answered to the patient's satisfaction.     Nakeesha Bowler D

## 2014-04-16 NOTE — Progress Notes (Signed)
Patient arrived to room via stretcher from PACU. Safety precautions and orders reviewed with patient. VSS. Oncoming nurse received report from PACU RN. Patient appears in no distress. Will continue to monitor.  Ave Filter, RN

## 2014-04-16 NOTE — Progress Notes (Signed)
Awake, alert, conversant.  Doing well.

## 2014-04-16 NOTE — Op Note (Signed)
04/16/2014  5:19 PM  PATIENT:  Evelyn Washington  74 y.o. female  PRE-OPERATIVE DIAGNOSIS:  Low back pain, unspecified; Spondylosis of lumbar region w/o myelopathy or radiculopathy with chronic pain with spinal cord stimulator and depleted implantable pulse generator  POST-OPERATIVE DIAGNOSIS:  Low back pain, unspecified; Spondylosis of lumbar region w/o myelopathy or radiculopathy with chronic pain with spinal cord stimulator and depleted implantable pulse generator  PROCEDURE:  Procedure(s) with comments: Spinal cord stimulator implantable pulse generator revision (N/A) - Spinal cord stimulator implantable pulse generator revision  SURGEON:  Surgeon(s) and Role:    * Erline Levine, MD - Primary  PHYSICIAN ASSISTANT:   ASSISTANTS: Poteat, RN   ANESTHESIA:   general  EBL:  Total I/O In: 800 [I.V.:800] Out: -   BLOOD ADMINISTERED:none  DRAINS: none   LOCAL MEDICATIONS USED:  LIDOCAINE   SPECIMEN:  No Specimen  DISPOSITION OF SPECIMEN:  N/A  COUNTS:  YES  TOURNIQUET:  * No tourniquets in log *  DICTATION: DICTATION: Patient has implanted spinal cord stimulator IPG, which is now depleted.  It was elected for patient to undergo IPG revision.  PROCEDURE: Patient was brought to the operating room and given intravenous sedation.  Left lower abdomen was prepped with betadine scrub and Duraprep.  Area of planned incision was infiltrated with lidocaine.  Prior incision was reopened and the old IPG was externalized.  Adaptor was connected to new IPG which was placed in the pocket.  Wound was irrigated with vancomycin irrigation. Then irrigated once more.  Incision was closed with 2-0 Vicryl and 3-0 vicryl sutures and dressed with a sterile occlusive dressing.  Counts were correct at the end of the case.  PLAN OF CARE: Admit for overnight observation  PATIENT DISPOSITION:  PACU - hemodynamically stable.   Delay start of Pharmacological VTE agent (>24hrs) due to surgical blood loss or  risk of bleeding: yes

## 2014-04-16 NOTE — Transfer of Care (Signed)
Immediate Anesthesia Transfer of Care Note  Patient: Evelyn Washington  Procedure(s) Performed: Procedure(s) with comments: Spinal cord stimulator implantable pulse generator revision (N/A) - Spinal cord stimulator implantable pulse generator revision  Patient Location: PACU  Anesthesia Type:General  Level of Consciousness: awake, alert  and oriented  Airway & Oxygen Therapy: Patient Spontanous Breathing and Patient connected to nasal cannula oxygen  Post-op Assessment: Report given to RN and Post -op Vital signs reviewed and stable  Post vital signs: Reviewed and stable  Last Vitals:  Filed Vitals:   04/16/14 1351  BP: 114/49  Pulse: 68  Temp: 36.7 C  Resp: 18    Complications: No apparent anesthesia complications

## 2014-04-16 NOTE — Anesthesia Preprocedure Evaluation (Signed)
Anesthesia Evaluation  Patient identified by MRN, date of birth, ID band Patient awake    History of Anesthesia Complications (+) PONV  Airway Mallampati: II  TM Distance: >3 FB Neck ROM: Full    Dental   Pulmonary sleep apnea , pneumonia -,  breath sounds clear to auscultation        Cardiovascular hypertension, + angina Rhythm:Regular Rate:Normal     Neuro/Psych Seizures -,  Anxiety Depression Bipolar Disorder    GI/Hepatic negative GI ROS, Neg liver ROS,   Endo/Other  diabetes  Renal/GU Renal disease     Musculoskeletal   Abdominal   Peds  Hematology   Anesthesia Other Findings   Reproductive/Obstetrics                             Anesthesia Physical Anesthesia Plan  ASA: III  Anesthesia Plan: General   Post-op Pain Management:    Induction: Intravenous  Airway Management Planned: Oral ETT  Additional Equipment:   Intra-op Plan:   Post-operative Plan: Extubation in OR  Informed Consent: I have reviewed the patients History and Physical, chart, labs and discussed the procedure including the risks, benefits and alternatives for the proposed anesthesia with the patient or authorized representative who has indicated his/her understanding and acceptance.   Dental advisory given  Plan Discussed with: CRNA, Anesthesiologist and Surgeon  Anesthesia Plan Comments:         Anesthesia Quick Evaluation

## 2014-04-16 NOTE — Anesthesia Procedure Notes (Signed)
Procedure Name: Intubation Date/Time: 04/16/2014 4:13 PM Performed by: Susa Loffler Pre-anesthesia Checklist: Patient identified, Timeout performed, Emergency Drugs available, Suction available and Patient being monitored Patient Re-evaluated:Patient Re-evaluated prior to inductionOxygen Delivery Method: Circle system utilized Preoxygenation: Pre-oxygenation with 100% oxygen Intubation Type: IV induction Ventilation: Mask ventilation without difficulty and Oral airway inserted - appropriate to patient size Laryngoscope Size: Mac and 4 Grade View: Grade I Tube type: Oral Tube size: 7.5 mm Number of attempts: 1 Airway Equipment and Method: Stylet and Oral airway Placement Confirmation: ETT inserted through vocal cords under direct vision,  positive ETCO2 and breath sounds checked- equal and bilateral Secured at: 20 cm Tube secured with: Tape Dental Injury: Teeth and Oropharynx as per pre-operative assessment

## 2014-04-16 NOTE — Discharge Instructions (Signed)

## 2014-04-16 NOTE — Interval H&P Note (Signed)
History and Physical Interval Note:  04/16/2014 3:50 PM  Evelyn Washington  has presented today for surgery, with the diagnosis of Low back pain, unspecified; Spondylosis of lumbar region w/o myelopathy or radiculopathy  The various methods of treatment have been discussed with the patient and family. After consideration of risks, benefits and other options for treatment, the patient has consented to  Procedure(s) with comments: Spinal cord stimulator implantable pulse generator revision (N/A) - Spinal cord stimulator implantable pulse generator revision as a surgical intervention .  The patient's history has been reviewed, patient examined, no change in status, stable for surgery.  I have reviewed the patient's chart and labs.  Questions were answered to the patient's satisfaction.     Charod Slawinski D

## 2014-04-16 NOTE — Brief Op Note (Signed)
04/16/2014  5:19 PM  PATIENT:  Evelyn Washington  74 y.o. female  PRE-OPERATIVE DIAGNOSIS:  Low back pain, unspecified; Spondylosis of lumbar region w/o myelopathy or radiculopathy with chronic pain with spinal cord stimulator and depleted implantable pulse generator  POST-OPERATIVE DIAGNOSIS:  Low back pain, unspecified; Spondylosis of lumbar region w/o myelopathy or radiculopathy with chronic pain with spinal cord stimulator and depleted implantable pulse generator  PROCEDURE:  Procedure(s) with comments: Spinal cord stimulator implantable pulse generator revision (N/A) - Spinal cord stimulator implantable pulse generator revision  SURGEON:  Surgeon(s) and Role:    * Erline Levine, MD - Primary  PHYSICIAN ASSISTANT:   ASSISTANTS: Poteat, RN   ANESTHESIA:   general  EBL:  Total I/O In: 800 [I.V.:800] Out: -   BLOOD ADMINISTERED:none  DRAINS: none   LOCAL MEDICATIONS USED:  LIDOCAINE   SPECIMEN:  No Specimen  DISPOSITION OF SPECIMEN:  N/A  COUNTS:  YES  TOURNIQUET:  * No tourniquets in log *  DICTATION: DICTATION: Patient has implanted spinal cord stimulator IPG, which is now depleted.  It was elected for patient to undergo IPG revision.  PROCEDURE: Patient was brought to the operating room and given intravenous sedation.  Left lower abdomen was prepped with betadine scrub and Duraprep.  Area of planned incision was infiltrated with lidocaine.  Prior incision was reopened and the old IPG was externalized.  Adaptor was connected to new IPG which was placed in the pocket.  Wound was irrigated with vancomycin irrigation. Then irrigated once more.  Incision was closed with 2-0 Vicryl and 3-0 vicryl sutures and dressed with a sterile occlusive dressing.  Counts were correct at the end of the case.  PLAN OF CARE: Admit for overnight observation  PATIENT DISPOSITION:  PACU - hemodynamically stable.   Delay start of Pharmacological VTE agent (>24hrs) due to surgical blood loss or  risk of bleeding: yes

## 2014-04-16 NOTE — Anesthesia Postprocedure Evaluation (Signed)
  Anesthesia Post-op Note  Patient: Evelyn Washington  Procedure(s) Performed: Procedure(s) with comments: Spinal cord stimulator implantable pulse generator revision (N/A) - Spinal cord stimulator implantable pulse generator revision  Patient Location: PACU  Anesthesia Type:General  Level of Consciousness: awake  Airway and Oxygen Therapy: Patient Spontanous Breathing  Post-op Pain: mild  Post-op Assessment: Post-op Vital signs reviewed  Post-op Vital Signs: Reviewed  Last Vitals:  Filed Vitals:   04/16/14 1730  BP:   Pulse: 76  Temp:   Resp: 18    Complications: No apparent anesthesia complications

## 2014-04-17 DIAGNOSIS — M47816 Spondylosis without myelopathy or radiculopathy, lumbar region: Secondary | ICD-10-CM | POA: Diagnosis not present

## 2014-04-17 LAB — GLUCOSE, CAPILLARY
GLUCOSE-CAPILLARY: 207 mg/dL — AB (ref 70–99)
Glucose-Capillary: 157 mg/dL — ABNORMAL HIGH (ref 70–99)

## 2014-04-17 MED ORDER — CHLORHEXIDINE GLUCONATE CLOTH 2 % EX PADS: 6.0000 | MEDICATED_PAD | Freq: Every day | CUTANEOUS | Status: DC

## 2014-04-17 MED ORDER — MUPIROCIN 2 % EX OINT
1.0000 "application " | TOPICAL_OINTMENT | Freq: Two times a day (BID) | CUTANEOUS | Status: DC
  Filled 2014-04-17: qty 22

## 2014-04-17 NOTE — Clinical Social Work Note (Signed)
CSW spoke with pt at bedside. Pt reported that she do not wish to be transported by [facility]. Pt reported that she talked to Maudie Mercury at [facility] who indicated that they will send a wheel chair van to pick the pt up. CSW spoke with Maudie Mercury to confirm this transport. CSW updated bedside RN.   [place], MSW, [place]

## 2014-04-17 NOTE — Clinical Social Work Note (Signed)
CSW Consult Acknowledged:   CSW received a consult for SNF placement. CSW awaiting PT/OT evaluation to determine the appropriate level of care.      Jonn Chaikin, MSW, LCSWA [phone]  

## 2014-04-17 NOTE — Progress Notes (Signed)
D/C orders received, [facility]  IV and telemetry D/C.  Rx and D/C instructions given with verbalized understanding. Staff brought pt downstairs via wheelchair.

## 2014-04-17 NOTE — Evaluation (Signed)
Physical Therapy Evaluation Patient Details Name: Evelyn Washington MRN: [id number] DOB: Apr 20, 1940 Today's Date: 04/17/2014   History of Present Illness  s/p spinal stimulator implant battery change  Clinical Impression  Patient evaluated by Physical Therapy with needs to be addressed at SNF upon return today. All education has been completed and the patient has no further questions. Pt able to ambulate only 5' with RW before knees begin to buckle.  See below for any follow-up Physial Therapy or equipment needs. PT is signing off. Thank you for this referral.     Follow Up Recommendations SNF;Supervision for mobility/OOB    Equipment Recommendations  None recommended by PT    Recommendations for Other Services       Precautions / Restrictions Precautions Precautions: Back;Fall Precaution Booklet Issued: No Precaution Comments: back precautions for comfort Restrictions Weight Bearing Restrictions: No      Mobility  Bed Mobility Overal bed mobility: Modified Independent             General bed mobility comments: pt received in w/c and returned to w/c for d/c today  Transfers Overall transfer level: Needs assistance Equipment used: Rolling walker (2 wheeled) Transfers: Sit to/from Stand Sit to Stand: Min guard Stand pivot transfers: Min guard       General transfer comment: pt stood from toilet and w/c with min-guard A, hand on arm or grab bar for power up  Ambulation/Gait Ambulation/Gait assistance: Min assist Ambulation Distance (Feet): 5 Feet Assistive device: Rolling walker (2 wheeled) Gait Pattern/deviations: Step-through pattern;Decreased stride length;Trunk flexed;Shuffle Gait velocity: decreased Gait velocity interpretation: Below normal speed for age/gender General Gait Details: plan was to ambulate from bathroom into room but pt was able to ambulate only 5' before having increased pain and knees beginning to buckle, brought w/c to her at that  point.  Stairs            Information systems manager mobility: Yes Wheelchair propulsion: Both upper extremities;Both lower extermities Wheelchair parts: Independent Distance: 10  Modified Rankin (Stroke Patients Only)       Balance Overall balance assessment: Needs assistance Sitting-balance support: No upper extremity supported;Feet supported Sitting balance-Leahy Scale: Good     Standing balance support: Bilateral upper extremity supported;During functional activity Standing balance-Leahy Scale: Poor Standing balance comment: pt fatigued in standing and required UE support to maintain balance. Bilateral knee weakness decreased standing tolerance and safety                             Pertinent Vitals/Pain Pain Assessment: 0-10 Pain Score: 5  Pain Location: back Pain Descriptors / Indicators: Aching (chronic) Pain Intervention(s): Monitored during session    Home Living Family/patient expects to be discharged to:: Skilled nursing facility                      Prior Function Level of Independence: Needs assistance   Gait / Transfers Assistance Needed: primarily used w/c, walked short distances with RW but had gotten to the point that she could no longer ambulate all the way to the dining room  ADL's / Homemaking Assistance Needed: staff assisted with bathing        Hand Dominance   Dominant Hand: Right    Extremity/Trunk Assessment   Upper Extremity Assessment: Defer to OT evaluation           Lower Extremity Assessment: RLE deficits/detail;LLE deficits/detail;Generalized weakness RLE Deficits / Details: knee ext  3/5, knee flex 3/5, hip flex 2+/5 LLE Deficits / Details: knee ext 3+/5, knee flex 3+/5, hip flex 3-/5  Cervical / Trunk Assessment: Kyphotic  Communication   Communication: No difficulties  Cognition Arousal/Alertness: Awake/alert Behavior During Therapy: WFL for tasks  assessed/performed Overall Cognitive Status: Within Functional Limits for tasks assessed                      General Comments General comments (skin integrity, edema, etc.): xxx    Exercises Other Exercises Other Exercises: bilateral LAQ with light manual resistance for ext and flex on left (could not tolerate on right), 3 sec hold, 10 reps      Assessment/Plan    PT Assessment All further PT needs can be met in the next venue of care  PT Diagnosis Difficulty walking;Generalized weakness;Acute pain;Abnormality of gait   PT Problem List Decreased strength;Decreased range of motion;Decreased activity tolerance;Decreased balance;Decreased mobility;Pain  PT Treatment Interventions     PT Goals (Current goals can be found in the Care Plan section) Acute Rehab PT Goals Patient Stated Goal: to return to SNF PT Goal Formulation: All assessment and education complete, DC therapy    Frequency     Barriers to discharge        Co-evaluation               End of Session Equipment Utilized During Treatment: Gait belt Activity Tolerance: Patient limited by fatigue Patient left: in chair;with call bell/phone within reach Nurse Communication: Mobility status    Functional Assessment Tool Used: clinical judgement Functional Limitation: Mobility: Walking and moving around Mobility: Walking and Moving Around Current Status (W8889): At least 20 percent but less than 40 percent impaired, limited or restricted Mobility: Walking and Moving Around Goal Status [phone]): At least 20 percent but less than 40 percent impaired, limited or restricted Mobility: Walking and Moving Around Discharge Status [phone]): At least 20 percent but less than 40 percent impaired, limited or restricted    Time: 1350-1411 PT Time Calculation (min) (ACUTE ONLY): 21 min   Charges:   PT Evaluation $Initial PT Evaluation Tier I: 1 Procedure     PT G Codes:   PT G-Codes **NOT FOR INPATIENT  CLASS** Functional Assessment Tool Used: clinical judgement Functional Limitation: Mobility: Walking and moving around Mobility: Walking and Moving Around Current Status (K8003): At least 20 percent but less than 40 percent impaired, limited or restricted Mobility: Walking and Moving Around Goal Status [phone]): At least 20 percent but less than 40 percent impaired, limited or restricted Mobility: Walking and Moving Around Discharge Status [phone]): At least 20 percent but less than 40 percent impaired, limited or restricted  Leighton Roach, PT  Acute Rehab Services  [place], [place] 04/17/2014, 3:06 PM

## 2014-04-17 NOTE — Clinical Social Work Psychosocial (Signed)
Clinical Social Work Department BRIEF PSYCHOSOCIAL ASSESSMENT 04/17/2014  Patient:  Evelyn Washington, Evelyn Washington     Account Number:  [id number]     Admit date:  04/16/2014  Clinical Social Worker:  Marciano Sequin  Date/Time:  04/17/2014 12:22 PM  Referred by:  RN  Date Referred:  04/17/2014 Referred for  SNF Placement   Other Referral:   Interview type:  Patient Other interview type:    PSYCHOSOCIAL DATA Living Status:  FACILITY Admitted from facility:  [facility] Level of care:  Long Term Acute Primary support name:  Evelyn Washington,Evelyn Washington Primary support relationship to patient:  CHILD, ADULT Degree of support available:   Strong Support    CURRENT CONCERNS Current Concerns  None Noted   Other Concerns:    SOCIAL WORK ASSESSMENT / PLAN CSW met the pt at the bedside.  CSW introduced self and purpose of the visit. Pt identified being a long term care resident at [facility]. Pt expressed desire to return. CSW answered all questions in which the pt inquired about. CSW provided pt with contact information for further questions. CSW will continue to follow this pt and assist with discharge as needed.   Assessment/plan status:  Psychosocial Support/Ongoing Assessment of Needs Other assessment/ plan:   Information/referral to community resources:    PATIENT'S/FAMILY'S RESPONSE TO CURRENT DIAGNOSE: The pt presented to be in good spirits despite lower back pain. Pt reported understanding her current diagnose well. Pt joked around regarding current diagnose.    PATIENT'S/FAMILY'S RESPONSE TO PLAN OF CARE: Pt is agreeable to return back to [facility]. CSW spoke with pt regarding discharge today. Pt reported that she will inform her daughter Evelyn Washington regarding discharge. CSW uploaded D/C summary to [facility]. CSW arranged transport via [facility]. Bedside RN provided number to call reported.   [place], MSW,  [facility]

## 2014-04-17 NOTE — Progress Notes (Signed)
Subjective: Patient reports "I feel fine...its just a little tender"  Objective: Vital signs in last 24 hours: Temp:  [97.8 F (36.6 C)-98.7 F (37.1 C)] 98.3 F (36.8 C) (03/02 0636) Pulse Rate:  [68-79] 75 (03/02 0636) Resp:  [14-26] 18 (03/02 0636) BP: (114-152)/(46-62) 150/59 mmHg (03/02 0636) SpO2:  [96 %-100 %] 97 % (03/02 0636) Weight:  [104.327 kg (230 lb)] 104.327 kg (230 lb) (03/01 1351)  Intake/Output from previous day: 03/01 0701 - 03/02 0700 In: 803 [I.V.:803] Out: 1 [Stool:1] Intake/Output this shift:    Awakens to voice, reporting restful night. IPG site left abdomen with Dermabond & honeycomb drsg. No erythema, swelling, or drainage. Gentle palpation elicits report of mild tenderness as expected, but no fluid collection. Spinal Cord Stimulator is not yet "on" as she was too sleepy last evening for [employer] to get accurate impression of her pain relief. Plan is to turn hert device on in office.  Lab Results:  Recent Labs  04/16/14 1357  WBC 6.9  HGB 11.7*  HCT 36.9  PLT 188   BMET  Recent Labs  04/16/14 1357  NA 143  K 3.8  CL 108  CO2 25  GLUCOSE 209*  BUN 17  CREATININE 1.06  CALCIUM 9.1    Studies/Results: No results found.  Assessment/Plan: Doing well after spinal cord stimulator implanted pulse generator change.    Per DrSter, d/c IV, d/c to her assisted living facility. Pt verbalizes understanding of d/c instructions & need to call office for 2 week f/u with incision check & SCS activation.  No "pink sheet" present to record info for SNF...:  Ok to shower. Remove honeycomb dressing next 1-2 days. Ok to cleanse over incision with soapy washcloth. Glue will wash off in 3-4 weeks. No tub/pool until incision well healed. Call office for 2 week appt to check incision and turn device "on". (Daughter may turn device on with handheld if able.)   Evelyn Washington, Evelyn Washington 04/17/2014, 8:10 AM

## 2014-04-17 NOTE — Progress Notes (Signed)
UR completed 

## 2014-04-17 NOTE — Discharge Summary (Signed)
Physician Discharge Summary  Patient ID: Evelyn Washington MRN: [id number] DOB/AGE: 03/01/1940 74 y.o.  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 Following uncomplicated surgery, she recovered well and transferred to [facility]N for observation due to vast medical history.   Consults: None  Significant Diagnostic Studies:   Treatments: surgery: Spinal cord stimulator implantable pulse generator revision (N/A) - Spinal cord stimulator implantable pulse generator revision   Discharge Exam: Blood pressure 150/59, pulse 75, temperature 98.3 F (36.8 C), temperature source Oral, resp. rate 18, height 4\' 11"  (1.499 m), weight 104.327 kg (230 lb), SpO2 97 %. Awakens to voice, reporting restful night. IPG site left abdomen with Dermabond & honeycomb drsg. No erythema, swelling, or drainage. Gentle palpation elicits report of mild tenderness as expected, but no fluid collection. Spinal Cord Stimulator is not yet "on" as she was too sleepy last evening for Medtronic to get accurate impression of her pain relief. Plan is to turn hert device on in office.   Disposition: 03-Skilled Nursing Facility Doing well after spinal cord stimulator implanted pulse generator change.      Pt verbalizes  understanding of d/c instructions & need to call office for 2 week f/u with incision check & SCS activation.   No "pink sheet" present to record info for SNF...:  Ok to shower. Remove honeycomb dressing next 1-2 days. Ok to cleanse over incision with soapy washcloth. Glue will wash off in 3-4 weeks. No tub/pool until incision well healed. Call office for 2 week appt to check incision and turn device "on". (Daughter may turn device on with handheld if able.)      Medication List    ASK your doctor about these medications        acetaminophen 325 MG tablet  Commonly known as:  TYLENOL  Take 650 mg by mouth every 4 (four) hours as needed for mild pain.     ALPRAZolam 0.25 MG tablet  Commonly known as:  XANAX  Take 0.25 mg by mouth 3 (three) times daily as needed for anxiety.     ARIPiprazole 2 MG tablet  Commonly known as:  ABILIFY  Take 2 mg by mouth daily.     Choline Fenofibrate 45 MG capsule  Take 45 mg by mouth at bedtime.     citalopram 20 MG tablet  Commonly known as:  CELEXA  Take 1 tablet (20 mg total) by mouth daily.     docusate sodium 100 MG capsule  Commonly known as:  COLACE  Take 100 mg by mouth 2 (two) times daily as needed for mild constipation.     dorzolamide 2 % ophthalmic solution  Commonly known as:  TRUSOPT  Place 1 drop into both eyes 3 (three) times daily.     enoxaparin 30 MG/0.3ML injection  Commonly known as:  LOVENOX  Inject 0.3 mLs (30 mg total) into the skin every 12 (twelve) hours. Continue lovenox until INR therapeutic  escitalopram 10 MG tablet  Commonly known as:  LEXAPRO  Take 10 mg by mouth daily.     furosemide 40 MG tablet  Commonly known as:  LASIX  Take 40 mg by mouth daily.     gabapentin 600 MG tablet  Commonly known as:  NEURONTIN  Take 0.5 tablets (300 mg total) by mouth 4 (four) times daily - after meals and at bedtime.     gabapentin 300 MG capsule  Commonly known as:  NEURONTIN  Take 300 mg by mouth 3 (three) times  daily.     glimepiride 2 MG tablet  Commonly known as:  AMARYL  Take 2 mg by mouth daily with breakfast.     HYDROcodone-acetaminophen 5-325 MG per tablet  Commonly known as:  NORCO/VICODIN  Take 1 tablet by mouth every 8 (eight) hours as needed.     insulin detemir 100 UNIT/ML injection  Commonly known as:  LEVEMIR  Inject 42 Units into the skin daily.     lamoTRIgine 100 MG tablet  Commonly known as:  LAMICTAL  Take 100 mg by mouth every morning.     lamoTRIgine 150 MG tablet  Commonly known as:  LAMICTAL  Take 150 mg by mouth at bedtime.     lisinopril 5 MG tablet  Commonly known as:  PRINIVIL,ZESTRIL  Take 5 mg by mouth daily.     metFORMIN 1000 MG tablet  Commonly known as:  GLUCOPHAGE  Take 1,000 mg by mouth 2 (two) times daily with a meal.     methocarbamol 500 MG tablet  Commonly known as:  ROBAXIN  Take 1.5 tablets (750 mg total) by mouth 3 (three) times daily as needed.     oxyCODONE-acetaminophen 5-325 MG per tablet  Commonly known as:  PERCOCET/ROXICET  Take 0.5 tablets by mouth 3 (three) times daily.     promethazine 25 MG tablet  Commonly known as:  PHENERGAN  Take 25 mg by mouth every 6 (six) hours as needed for nausea or vomiting.     propranolol 40 MG tablet  Commonly known as:  INDERAL  Take 80 mg by mouth 2 (two) times daily.     risperiDONE 1 MG disintegrating tablet  Commonly known as:  RISPERDAL M-TABS  Take 1 mg by mouth at bedtime.     simvastatin 40 MG tablet  Commonly known as:  ZOCOR  Take 40 mg by mouth every evening.     sodium chloride 0.65 % Soln nasal spray  Commonly known as:  OCEAN  Place 1 spray into both nostrils 3 (three) times daily as needed for congestion.     traMADol 50 MG tablet  Commonly known as:  ULTRAM  Take 50 mg by mouth every 6 (six) hours as needed for moderate pain.     Vitamin D (Ergocalciferol) 50000 UNITS Caps capsule  Commonly known as:  DRISDOL  Take 50,000 Units by mouth every 30 (thirty) days.  Take on the 27 th of every month     warfarin 6 MG tablet  Commonly known as:  COUMADIN  Take 6 mg by mouth daily. Take along with 0.5 mg for a total of 6.5mg  daily     warfarin 1 MG tablet  Commonly known as:  COUMADIN  Take 0.5 mg by mouth daily. Take along with 6 mg for a total of 6.5 mg           Follow-up Information    Follow up with Peggyann Shoals, MD.   Specialty:  Neurosurgery   Contact information:   [address] [place] [address] [place] [address] [phone]       Signed: Verdis Prime 04/17/2014, 8:17 AM

## 2014-04-17 NOTE — Progress Notes (Signed)
Occupational Therapy Evaluation Patient Details Name: Evelyn Washington MRN: [id number] DOB: 1941/01/26 Today's Date: 04/17/2014    History of Present Illness s/p spinal stimulator implant   Clinical Impression   Pt making good progress. Ready to D/C back to SNF when medically stable. Feel pt would benefit from OT/PT at SNF after D/C. Pt agreed with plan.     Follow Up Recommendations  SNF    Equipment Recommendations  None recommended by OT    Recommendations for Other Services       Precautions / Restrictions Precautions Precautions: Back;Other (comment);Fall (for comfort) Restrictions Weight Bearing Restrictions: No      Mobility Bed Mobility Overal bed mobility: Modified Independent                Transfers Overall transfer level: Needs assistance   Transfers: Sit to/from Stand;Stand Pivot Transfers Sit to Stand: Min guard Stand pivot transfers: Min guard       General transfer comment: Pt able to direct therapist regarding her normal set up of w/c    Balance Overall balance assessment: Needs assistance           Standing balance-Leahy Scale: Fair                              ADL Overall ADL's : Needs assistance/impaired         Upper Body Bathing: Set up   Lower Body Bathing: Moderate assistance   Upper Body Dressing : Set up   Lower Body Dressing: Minimal assistance;Sit to/from stand       Toileting- Water quality scientist and Hygiene: Minimal assistance Toileting - Clothing Manipulation Details (indicate cue type and reason): demonstrated use of "toilet aid" to increase independence. pt verbalized understanding.      Functional mobility during ADLs: Min guard General ADL Comments: Pt states she feels weaker than normal. Pt could benefit from further training with AE and UE strengthening                     Pertinent Vitals/Pain Pain Assessment: 0-10 Pain Score: 5  Pain Location: back Pain Descriptors /  Indicators: Aching (chronic) Pain Intervention(s): Limited activity within patient's tolerance;Monitored during session     Hand Dominance Right   Extremity/Trunk Assessment Upper Extremity Assessment Upper Extremity Assessment: Overall WFL for tasks assessed   Lower Extremity Assessment Lower Extremity Assessment: Defer to PT evaluation   Cervical / Trunk Assessment Cervical / Trunk Assessment: Other exceptions (hx of multiple surgeries)   Communication     Cognition Arousal/Alertness: Awake/alert Behavior During Therapy: WFL for tasks assessed/performed Overall Cognitive Status: Within Functional Limits for tasks assessed                     General Comments   Pt with redness and "cut/skin crack" in fold underneath pannus - nsg notified. Pt may do well with "interdry"    Exercises Exercises: Other exercises Other Exercises Other Exercises: BUE general strengthening with level 1 theraband   Shoulder Instructions      Home Living Family/patient expects to be discharged to:: Skilled nursing facility                                        Prior Functioning/Environment Level of Independence: Needs assistance  Gait / Transfers Assistance Needed: primarily used w/c  ADL's / Homemaking Assistance Needed: staff assisted with bathing        OT Diagnosis: Generalized weakness;Acute pain   OT Problem List: Decreased strength;Decreased activity tolerance;Decreased knowledge of use of DME or AE;Obesity;Pain   OT Treatment/Interventions:      OT Goals(Current goals can be found in the care plan section) Acute Rehab OT Goals Patient Stated Goal: to return to SNF OT Goal Formulation: All assessment and education complete, DC therapy  OT Frequency:     Barriers to D/C:            Co-evaluation              End of Session Nurse Communication: Mobility status;Other (comment) (skin integrity)  Activity Tolerance: Patient tolerated treatment  well Patient left: in chair;with call bell/phone within reach   Time: 1008-1040 OT Time Calculation (min): 32 min Charges:  OT General Charges $OT Visit: 1 Procedure OT Evaluation $Initial OT Evaluation Tier I: 1 Procedure OT Treatments $Self Care/Home Management : 8-22 mins G-Codes: OT G-codes **NOT FOR INPATIENT CLASS** Functional Assessment Tool Used: clinical judgement Functional Limitation: Self care Self Care Current Status (J8841): At least 1 percent but less than 20 percent impaired, limited or restricted Self Care Goal Status (Y6063): At least 1 percent but less than 20 percent impaired, limited or restricted Self Care Discharge Status [phone]): At least 1 percent but less than 20 percent impaired, limited or restricted  Amaka Gluth,HILLARY 04/17/2014, 1:24 PM   [facility], OTR/L  [phone] 04/17/2014

## 2014-04-19 ENCOUNTER — Encounter (HOSPITAL_COMMUNITY): Payer: Self-pay | Admitting: Neurosurgery

## 2014-06-07 ENCOUNTER — Emergency Department
Admit: 2014-06-07 | Disposition: A | Discharge status: Home / Self Care | Payer: Self-pay | Admitting: Emergency Medicine

## 2014-06-07 NOTE — Discharge Summary (Signed)
PATIENT NAME:  Evelyn Washington, Evelyn Washington MR#:  [id number] DATE OF BIRTH:  1941-02-01  DATE OF ADMISSION:  02/24/2012 DATE OF DISCHARGE:  02/24/2012  ADMISSION DIAGNOSIS: Fall.   DISCHARGE DIAGNOSES:  1. 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  2. Hypertension.  3. Hyperlipidemia.  4. History of pulmonary embolus.   IMAGING:  CT cervical spine shows severe degenerative joint disease.  CT thoracic spine: No acute bony abnormality. Degenerative changes are present CT of the lumbar spine: Severe degenerative joint disease without evidence of acute abnormality.   HOSPITAL COURSE: The patient is a 74 year old female with a history of chronic DJD, multiple back surgeries, who was brought in for falls with increasing frequency. She was seen by a neurologist who sent her to the hospital for further evaluation. For further details, please refer to the H and P.   1. Fall: The patient was admitted to the hospitalist service under observation. She had CT scans as mentioned above. She has severe DJH which is likely the reason for her multiple falls. Physical Therapy evaluated her. The patient does need rehab. The patient will be discharged to rehab today.  2. Hypertension: The patient will continue her medications.  3. Hyperlipidemia: The patient will continue statin.  4. History of pulmonary embolus: The patient may continue her Coumadin. Her INR is therapeutic at discharge.   DISCHARGE MEDICATIONS: 1. Gabapentin 600, 4 times a day.  2. Lasix 20 mg daily.  3. Risperdal 1 mg daily.  4. Methocarbamol 750 mg t.i.d.  5. Citalopram 40 mg daily.  6. Amitriptyline 50 mg t.i.d.  7. Simvastatin 40 mg daily.  8. Humulin N 65 units in the morning, 53 at night.  9. Metformin 1000 b.i.d.  10. Dorzolamide 2% ophthalmic solution each eye t.i.d.  11. Fentanyl 50 mcg q. 72 hours.  12. Vicodin 5/500 q. 4 to 6 hours p.r.n. pain.  13. Coumadin 6 mg daily.   DISCHARGE DIET: Low sodium and carbohydrate diet.    DISCHARGE ACTIVITY: As tolerated.   DISCHARGE REFERRAL: Physical therapy.   DISCHARGE FOLLOWUP: The patient may follow up with Dr. Lovie Macadamia in approximately 2 to 4 weeks.    TIME SPENT: Approximately 35 minutes.  ____________________________ Donell Beers. Benjie Karvonen, MD spm:cb D: 02/24/2012 13:29:17 ET T: 02/24/2012 13:38:58 ET JOB#: [id number]  cc: Aymee Fomby P. Benjie Karvonen, MD, <Dictator> Youlanda Roys. Lovie Macadamia, MD Donell Beers Donyetta Ogletree MD ELECTRONICALLY SIGNED 02/24/2012 14:02

## 2014-06-07 NOTE — H&P (Signed)
PATIENT NAME:  Evelyn Washington, Evelyn Washington MR#:  [id number] DATE OF BIRTH:  11/04/1940  DATE OF ADMISSION:  02/24/2012  CODE STATUS:  FULL CODE.   PRIMARY CARE PHYSICIAN:  Dr. Juluis Pitch.  NEUROLOGIST:  Dr. Manuella Ghazi.   CHIEF COMPLAINT:  Falling.   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  She sees neurology, Dr. Manuella Ghazi.  She walks with a cane.  She lives at home alone.  She states that since before Christmas, she has been falling.  Her legs simply give out and collapse beneath her.  She says this is unpredictable, occurs without warning.  She states that since between then and now, the frequency of her falls have escalated to where she is now falling almost every other day.  She has had to call the fire department at least 3 times to get her off the floor because she is unable to pick herself up.  She states she has hit her head once and that was on Christmas day and not since.  She has sustained no significant injuries.  She did go and see her PCP, who had her follow up with her neurologist, Dr. Manuella Ghazi.  This was done today and he sent to the ER for a full body CAT scan as well as admission for social reasons.  History provided by the patient, who is alert and oriented.  The patient did visit the ER before Christmas and because this, she was given a prescription for a wheelchair, however she states she has not been able to obtain one as yet.    REVIEW OF SYSTEMS:  All 10 point systems reviewed and negative except as noted in history of present illness.   PAST MEDICAL HISTORY:  Obstructive sleep apnea.  The patient does not use a CPAP.  Hypertension, dyslipidemia, restless leg syndrome, chronic back pain, neuropathy, anxiety and depression, diabetes mellitus, history of PEs and DVT, history of ovarian cancer and morbid obesity.   PAST SURGICAL HISTORY: Obstructive sleep apnea, hysterectomy  with bilateral oophorectomy, several back surgeries, total knee replacement right, implantable pain stimulator and she had the left shoulder surgery.   MEDICATIONS:  Amitriptyline 50 mg by mouth up to 3 tablets once daily, citalopram 40 mg daily, dorzolamide 2% ophthalmic solution in each eye 3 times daily, fentanyl patch 50 mcg every 72 hours, Lasix 20 mg daily, Gabapentin 600 mg 4 times daily, Humulin 65 units in the a.m., 50 units in the p.m., metformin 1000 mg twice daily, Robaxin 750 mg 3 times daily, risperidone 1 mg daily, simvastatin 40 mg daily, Vicodin 5/500 1 to 2 tablets every 4 to 6 hours as needed for pain, Coumadin 6 mg daily.   ALLERGIES:  ZOFRAN AND HALDOL.   SOCIAL HISTORY:  Negative for tobacco, alcohol, illicit drugs.  The patient lives alone at home.  She uses a walker.  She is a fall risk.  She does not have visiting nursing.   FAMILY HISTORY:  Significant for diabetes mellitus and coronary artery disease.   PHYSICAL EXAMINATION: VITAL SIGNS:  Blood pressure 136/91, pulse 79, respirations 16, temperature 98.3, saturating greater than 94% on room air.  GENERAL:  Alert and oriented female in no acute distress.  EYES:  Pink conjunctivae.  PERRLA.  EARS, NOSE, THROAT:  Moist oral mucosa.  Trachea midline.  NECK:  Supple.  LUNGS:  Clear.  No wheeze.  No use of accessory muscles.  CARDIOVASCULAR:  Regular rate and rhythm without murmurs, regurgitation or gallops.  No JVD.  ABDOMEN:  Soft, positive bowel sounds.  Nontender, nondistended.  No organomegaly.  NEUROLOGIC:  Cranial nerves II-XII grossly intact.  Sensation intact.  MUSCULOSKELETAL:  Strength V/V in all extremities.  No clubbing, cyanosis or edema.  SKIN:  No rashes.  No subcutaneous crepitation.  PSYCHIATRIC:  Alert, oriented, appropriate female.   LABORATORY DATA:  Sodium 143, potassium 3.8, chloride 108, CO2 27, BUN 15, creatinine 1.01, glucose 144, white blood count 8.5, hemoglobin 12.8, platelets 189, INR 2.1.   Urinalysis is essentially negative.  CT cervical spine shows severe DJD in the cervical spine.  CT thoracic spine shows no acute bony abnormality, DJD present, evident.  CT lumbar spine also shows severe DJD without any evidence of acute bony abnormality, chronic degenerative disk disease.  X-rays lumbar spine shows severe DJD as described.  No acute definite bony abnormality noted.   ASSESSMENT AND PLAN: 1.  Deconditioning/falls.  The patient will be brought in at the request of the ER physician for safety reasons.  Social services will be consulted for placement in the a.m.  The patient is admitted under observation status.  She is aware that this could present some difficulties.  Physical therapy has been consulted.  2.  Hypertension, dyslipidemia, restless leg syndrome, chronic back pain, neuropathy, anxiety and depression, diabetes, history of pulmonary embolus and deep vein thrombosis, obstructive sleep apnea.  Home medications have been resumed.  Patient's Neurontin has been decreased to 3 times daily.  The patient will be on an ADA diet and sliding scale insulin, pharmacy to manage patient's Coumadin.  No CPAP was been ordered as the patient does not use this at home.    ____________________________ Quintella Baton, MD dc:ea D: 02/24/2012 00:53:00 ET T: 02/24/2012 03:03:39 ET JOB#: [id number]  cc: Quintella Baton, MD, <Dictator> Quintella Baton MD ELECTRONICALLY SIGNED 04/07/2012 14:02

## 2014-06-08 NOTE — H&P (Signed)
PATIENT NAME:  Evelyn Washington, Evelyn Washington MR#:  [id number] DATE OF BIRTH:  April 08, 1940  DATE OF ADMISSION:  06/10/2013  REFERRING PHYSICIAN: Lurline Hare, MD   PRIMARY CARE PHYSICIAN: Juluis Pitch, MD   CHIEF COMPLAINT: Weakness.   HISTORY OF PRESENT ILLNESS: A 74 year old Caucasian female with history of hypertension, diabetes, hyperlipidemia, presenting with weakness. Describes a 4-day duration of weakness with inability to ambulate; however, she is in a wheelchair at baseline, as well as noted increased urinary frequency and dysuria for the same duration of time. She denies any fevers or chills or further symptomatology.   REVIEW OF SYSTEMS:  CONSTITUTIONAL: Positive for weakness, fatigue. Denies fevers, chills.  EYES: Denied blurred vision, double vision, eye pain.  ENT: Denies tinnitus, ear pain, hearing loss.  RESPIRATORY: Denies cough, wheeze, shortness of breath.  CARDIOVASCULAR: Denies chest pain, palpitations, edema.  GASTROINTESTINAL: No nausea, vomiting, diarrhea, abdominal pain.  GENITOURINARY: Denies dysuria, hematuria.  ENDOCRINE: Denies nocturia or thyroid problems.  HEMATOLOGY AND ONCOLOGY: Denies easy bruising, bleeding.  SKIN: Denies rash or lesions.  MUSCULOSKELETAL: Denies pain in neck, back, shoulder, knees, hips or arthritic symptoms.  NEUROLOGIC: Denies paralysis, paresthesias.  PSYCHIATRIC: Denies anxiety or depressive symptoms.  Otherwise, full review of systems performed by me is negative.   PAST MEDICAL HISTORY: Obstructive sleep apnea, noncompliant with CPAP therapy, hypertension, hyperlipidemia, restless legs syndrome, chronic lumbago, anxiety and depression, not otherwise specified.   SOCIAL HISTORY: Denies alcohol, tobacco or drug usage. Currently resides at a nursing facility, is in a wheelchair.   FAMILY HISTORY: Diabetes, coronary artery disease.   ALLERGIES: HALDOL, OXYCONTIN, PENTAZOCINE, ZOFRAN.   HOME MEDICATIONS: Acetaminophen/hydrocodone 325/10 mg p.o.  b.i.d. as needed for pain, fentanyl patch 75 mcg q.72 hours, lisinopril 40 mg p.o. daily, propranolol 80 mg p.o. b.i.d., warfarin 5.5 mg p.o. daily, gabapentin 600 mg p.o. 3 times daily, lamotrigine 100 mg p.o. q.a.m., 150 mg q.p.m., citalopram 20 mg p.o. daily, NPH 71 units a.m., 35 units p.m., metformin 500 mg 1-1/2 tablets p.o. b.i.d., fenofibric acid 45 mg p.o. at bedtime, simvastatin 40 mg p.o. daily, risperidone 2 mg p.o. b.i.d., alprazolam 0.25 mg p.o. at bedtime as needed for anxiety, nystatin 100,000 units/gram topical cream apply to affected area, Lasix 40 mg p.o. daily, potassium 20 mEq p.o. daily, baclofen 10 mg p.o. b.i.d., methocarbamol 500 mg p.o. b.i.d., dorzolamide 2% one drop to each eye twice daily, hydralazine 100 mg p.o. 3 times daily and vitamin D3 50,000 international units p.o. monthly.   PHYSICAL EXAMINATION: VITAL SIGNS: Temperature 98.3, heart rate 74, respirations 19, blood pressure 127/59, saturating 95% on room air. Weight 103 kg, BMI 45.9.  GENERAL: Chronically ill-appearing Caucasian female, currently in no acute distress.  HEAD: Normocephalic, atraumatic.  EYES: Pupils equal, round and reactive to light. Extraocular movements intact. No scleral icterus.  MOUTH: Dry mucosal membrane. Dentition intact. No abscess noted.  EAR, NOSE, THROAT: Clear without exudates. No external lesions. NECK: Supple. No thyromegaly. No nodules. No JVD.  PULMONARY: Clear to auscultation bilaterally without wheezes, rubs or rhonchi. No use of accessory muscles. Good respiratory effort.  CHEST: Nontender to palpation.  CARDIOVASCULAR: S1, S2, regular rate and rhythm. No murmurs, rubs or gallops. No edema. Pedal pulse 2+ bilaterally.  GASTROINTESTINAL: Soft, minimal tenderness suprapubic region without rebound, guarding or motion tenderness, nondistended, obese. Positive bowel sounds. No hepatosplenomegaly.  MUSCULOSKELETAL: No swelling, clubbing, edema. Range of motion full in all extremities.   NEUROLOGIC: Cranial nerves II through XII intact. No gross focal neurological deficits. Sensation intact.  Reflexes intact.  SKIN: No ulcerations, lesion or rash noted. Skin warm and dry. Turgor intact.  PSYCHIATRIC: Mood and affect blunted. Awake, alert, oriented x3. Insight and judgment intact.   LABORATORY DATA: Sodium 143, potassium 3.5, chloride 107, bicarbonate 31, BUN 21, creatinine 1.46, glucose 195. LFTs: Protein 6.2, albumin 3.2, bilirubin 0.4, alkaline phosphatase 35, remainder within normal limits. WBC 8.3, hemoglobin 10.3, platelets 192. INR of 2.7. Urinalysis: WBCs 25, RBCs 10, leukocyte esterase 2+, nitrite positive, bacteria 2+, epithelial cells none.   ASSESSMENT AND PLAN: A 74 year old Caucasian female presenting with weakness.  1. Acute kidney injury likely secondary to decreased p.o. intake as well as diuretic usage. Provide IV fluid hydration with normal saline. Follow urine output and renal function.  2. Urinary tract infection urine. Follow urine culture data. The patient has been placed on ceftriaxone, continue this.  3. Diabetes, type 2, insulin requiring. I spoke to the pharmacy, we have no NPH in the hospital. Subsequently, will use Levemir. When stable, will transition her to Levemir and decrease the dosage. Given her poor p.o. intake, add in some sliding scale with q.6 hour Accu-Cheks.  4. Hypertension. Oral hydralazine.  5. Venous thromboembolism prophylaxis. Therapeutic on warfarin therapy.   CODE STATUS: FULL CODE.   TIME SPENT: 45 minutes.    ____________________________ Aaron Mose. Hower, MD dkh:dr D: 06/10/2013 01:43:29 ET T: 06/10/2013 04:57:01 ET JOB#: [id number]  cc: Aaron Mose. Hower, MD, <Dictator> DAVID Woodfin Ganja MD ELECTRONICALLY SIGNED 06/10/2013 22:03

## 2014-06-08 NOTE — Discharge Summary (Signed)
PATIENT NAME:  Evelyn Washington, Evelyn Washington MR#:  [id number] DATE OF BIRTH:  August 21, 1940  DATE OF ADMISSION:  06/10/2013 DATE OF DISCHARGE:  06/13/2013  ADMITTING DIAGNOSIS: Generalized weakness, confusion.   DISCHARGE DIAGNOSES:  1.  Generalized weakness as well as acute encephalopathy felt to be due to multiple sedating medications, dehydration or possible cystitis.  2.  Acute renal failure due to dehydration, now resolved.  3.  Cystitis with urine cultures being negative.  4.  Diabetes.  5.  Hypertension.  6.  Obstructive sleep apnea, noncompliant with CPAP.  7.  Hyperlipidemia.  8.  Restless leg syndrome.  9.  Chronic back pain.  10. Anxiety and depression.   CONSULTANTS: Physical therapy.  PERTINENT LABS AND EVALUATIONS: Pelvic CT showed no evidence of hip fracture, degenerative changes and fusion in the lower lumbar spine, mildly distended urinary bladder. CT scan of the head showed no acute intracranial findings. Glucose was 133, BUN 20, creatinine 1.60, sodium 145, potassium 4.2, chloride 108, CO2 is 29, calcium 9.2. LFTs: Total protein 6.5, albumin 3.5, bili total 0.4, alk phos 37, AST is 22. ALT is 19. Troponin less than 0.79. TSH 1.29. WBC 10.2, hemoglobin 10.8, platelet count was 198. INR 2.7. Blood cultures x 2: No growth. Urine cultures showed 5000 CFUs of gram-negative rods. Urinalysis: 2+ leukocytes, bacteria 2+ and WBCs 15. Most recent renal function with creatinine of 0.83 on the 29th. CT scan of the head without contrast without any acute abnormality.   HOSPITAL COURSE: Please refer to H and P done by the admitting physician. The patient is a 74 year old white female with history of hypertension, diabetes, hyperlipidemia and anxiety disorder, who was brought in due to weakness and changes in mental status. The patient, in the ED, was noted to have acute renal failure, also abnormal UA suggestive of UTI. The patient was admitted, started on antibiotics and given IV fluids. She was also on  multiple medications that could make her sedated. All of these were held and she was given IV fluids. With these treatments, her mental status improved significantly. Currently, her mental status is back to baseline. Her renal function has normalized. The patient is very weak and debilitated and needs further rehab therapy. She is being arranged for rehab facility. She was previously living in assisted living. I have discussed the case with her daughter. She is agreeable to the plan.   DISCHARGE MEDICATIONS: Simvastatin 40 daily, vitamin D3 50,000 international units once a month, citalopram 20 daily, metformin 750, 1 tab p.o. b.i.d., propranolol 80, 1 tab p.o. b.i.d., fenofibrate 45 daily, nystatin apply to inner thighs 2 times a day until healed, warfarin 5.5 mg daily, dorzolamide 2% ophthalmic 1 drop to each eye 2 times a day, gabapentin 300, 1 tab p.o. t.i.d., lamotrigine 100 daily, Lamotrigine 150 at bedtime, Tylenol 650 q.4 hours p.r.n. for pain, Percocet 5/325, 1 tab p.o. q.8 hours p.r.n. for pain, Colace 100, 1 tab p.o. b.i.d., Ceftin 500, 1 tab p.o. b.i.d. x 4 days.  TREATMENT: Diabetic treatment for M.D. consult.   HOME OXYGEN: No.   DIET: Low-sodium, low-fat, low-cholesterol.   ACTIVITY: As tolerated. PT evaluation and treatment.   FOLLOWUP: Follow with primary M.D. in 1 to 2 weeks.   Please note, multiple medications were held. Recommend her primary M.D. to review these and slowly reintroduce if needed. At this point, the patient is comfortable and her pain and anxiety is under control with the current regimen.   TIME SPENT: 45 minutes.  ____________________________ Lafonda Mosses  Posey Pronto, MD shp:aw D: 06/13/2013 09:53:47 ET T: 06/13/2013 10:10:40 ET JOB#: [id number]  cc: Denece Shearer H. Posey Pronto, MD, <Dictator> Alric Seton MD ELECTRONICALLY SIGNED 06/20/2013 16:14

## 2014-06-08 NOTE — Discharge Summary (Signed)
PATIENT NAME:  Evelyn Washington, Evelyn Washington MR#:  [id number] DATE OF BIRTH:  [birth year]  DATE OF ADMISSION:  06/10/2013 DATE OF DISCHARGE:  06/14/2013  ADDENDUM  Please refer to discharge summary done yesterday on the patient. The patient needed a PASRR, therefore, she had to be kept in the hospital 1 additional day. Currently she is doing well and is stable. Medication changes: The patient has Phenergan added to her current regimen. Otherwise her medications are the same. All other activity, orders and diet orders are the same.   TIME SPENT: 35 minutes.  ____________________________ Lafonda Mosses Posey Pronto, MD shp:sb D: 06/14/2013 11:50:45 ET T: 06/14/2013 12:01:35 ET JOB#: [id number]  cc: Rashawn Rayman H. Posey Pronto, MD, <Dictator> Alric Seton MD ELECTRONICALLY SIGNED 06/20/2013 16:14

## 2014-10-15 ENCOUNTER — Other Ambulatory Visit: Payer: Self-pay | Admitting: Neurosurgery

## 2014-10-18 ENCOUNTER — Encounter (HOSPITAL_COMMUNITY): Payer: Self-pay | Admitting: *Deleted

## 2014-10-18 NOTE — Progress Notes (Signed)
I spoke with Evelyn Washington, English as a foreign language nurse at [facility] in [place], [place].  Evelyn Washington reports that patients fasting CBG runs from 160's to low 200's.

## 2014-10-21 NOTE — Anesthesia Preprocedure Evaluation (Addendum)
Anesthesia Evaluation  Patient identified by MRN, date of birth, ID band Patient awake    Reviewed: Allergy & Precautions, NPO status , Patient's Chart, lab work & pertinent test results  History of Anesthesia Complications (+) PONV and history of anesthetic complications  Airway Mallampati: II  TM Distance: >3 FB Neck ROM: Full    Dental no notable dental hx. (+) Dental Advisory Given, Edentulous Upper, Edentulous Lower   Pulmonary sleep apnea ,  breath sounds clear to auscultation  Pulmonary exam normal       Cardiovascular hypertension, Pt. on medications and Pt. on home beta blockers Normal cardiovascular examRhythm:Regular Rate:Normal     Neuro/Psych Seizures -,  PSYCHIATRIC DISORDERS Anxiety Depression Bipolar Disorder    GI/Hepatic negative GI ROS, Neg liver ROS,   Endo/Other  diabetesMorbid obesity  Renal/GU negative Renal ROS  negative genitourinary   Musculoskeletal negative musculoskeletal ROS (+) Arthritis -,   Abdominal (+) + obese,   Peds negative pediatric ROS (+)  Hematology negative hematology ROS (+)   Anesthesia Other Findings   Reproductive/Obstetrics negative OB ROS                           Anesthesia Physical Anesthesia Plan  ASA: III  Anesthesia Plan: General   Post-op Pain Management:    Induction: Intravenous  Airway Management Planned: Oral ETT  Additional Equipment:   Intra-op Plan:   Post-operative Plan: Extubation in OR  Informed Consent: I have reviewed the patients History and Physical, chart, labs and discussed the procedure including the risks, benefits and alternatives for the proposed anesthesia with the patient or authorized representative who has indicated his/her understanding and acceptance.   Dental advisory given  Plan Discussed with:   Anesthesia Plan Comments:        Anesthesia Quick Evaluation

## 2014-10-22 ENCOUNTER — Ambulatory Visit (HOSPITAL_COMMUNITY): Payer: Medicare Other | Admitting: Anesthesiology

## 2014-10-22 ENCOUNTER — Ambulatory Visit (HOSPITAL_COMMUNITY)
Admission: RE | Admit: 2014-10-22 | Discharge: 2014-10-22 | Disposition: A | Discharge status: Home / Self Care | Payer: Medicare Other | Source: Ambulatory Visit | Attending: Neurosurgery | Admitting: Neurosurgery

## 2014-10-22 ENCOUNTER — Encounter (HOSPITAL_COMMUNITY): Payer: Self-pay | Admitting: *Deleted

## 2014-10-22 ENCOUNTER — Encounter (HOSPITAL_COMMUNITY)
Admission: RE | Disposition: A | Discharge status: Home / Self Care | Payer: Self-pay | Source: Ambulatory Visit | Attending: Neurosurgery

## 2014-10-22 DIAGNOSIS — Z6841 Body Mass Index (BMI) 40.0 and over, adult: Secondary | ICD-10-CM | POA: Diagnosis not present

## 2014-10-22 DIAGNOSIS — Z9689 Presence of other specified functional implants: Secondary | ICD-10-CM | POA: Diagnosis not present

## 2014-10-22 DIAGNOSIS — M47816 Spondylosis without myelopathy or radiculopathy, lumbar region: Secondary | ICD-10-CM | POA: Diagnosis not present

## 2014-10-22 DIAGNOSIS — I10 Essential (primary) hypertension: Secondary | ICD-10-CM | POA: Diagnosis not present

## 2014-10-22 DIAGNOSIS — M4120 Other idiopathic scoliosis, site unspecified: Secondary | ICD-10-CM | POA: Insufficient documentation

## 2014-10-22 DIAGNOSIS — G8929 Other chronic pain: Secondary | ICD-10-CM | POA: Insufficient documentation

## 2014-10-22 DIAGNOSIS — M549 Dorsalgia, unspecified: Secondary | ICD-10-CM | POA: Diagnosis not present

## 2014-10-22 DIAGNOSIS — E119 Type 2 diabetes mellitus without complications: Secondary | ICD-10-CM | POA: Insufficient documentation

## 2014-10-22 HISTORY — PX: SPINAL CORD STIMULATOR BATTERY EXCHANGE: SHX6202

## 2014-10-22 LAB — CBC
HCT: 36.4 % (ref 36.0–46.0)
HEMOGLOBIN: 11.3 g/dL — AB (ref 12.0–15.0)
MCH: 28.7 pg (ref 26.0–34.0)
MCHC: 31 g/dL (ref 30.0–36.0)
MCV: 92.4 fL (ref 78.0–100.0)
PLATELETS: 160 10*3/uL (ref 150–400)
RBC: 3.94 MIL/uL (ref 3.87–5.11)
RDW: 14.6 % (ref 11.5–15.5)
WBC: 6 10*3/uL (ref 4.0–10.5)

## 2014-10-22 LAB — BASIC METABOLIC PANEL
Anion gap: 7 (ref 5–15)
BUN: 11 mg/dL (ref 6–20)
CALCIUM: 9 mg/dL (ref 8.9–10.3)
CHLORIDE: 108 mmol/L (ref 101–111)
CO2: 27 mmol/L (ref 22–32)
CREATININE: 0.92 mg/dL (ref 0.44–1.00)
GFR calc non Af Amer: 60 mL/min — ABNORMAL LOW (ref >60)
Glucose, Bld: 207 mg/dL — ABNORMAL HIGH (ref 65–99)
Potassium: 4.3 mmol/L (ref 3.5–5.1)
SODIUM: 142 mmol/L (ref 135–145)

## 2014-10-22 LAB — SURGICAL PCR SCREEN
MRSA, PCR: POSITIVE — AB
STAPHYLOCOCCUS AUREUS: POSITIVE — AB

## 2014-10-22 LAB — PROTIME-INR
INR: 1.24 (ref 0.00–1.49)
PROTHROMBIN TIME: 15.8 s — AB (ref 11.6–15.2)

## 2014-10-22 LAB — GLUCOSE, CAPILLARY
GLUCOSE-CAPILLARY: 188 mg/dL — AB (ref 65–99)
Glucose-Capillary: 168 mg/dL — ABNORMAL HIGH (ref 65–99)

## 2014-10-22 SURGERY — SPINAL CORD STIMULATOR BATTERY EXCHANGE
Anesthesia: General | Site: Abdomen

## 2014-10-22 MED ORDER — 0.9 % SODIUM CHLORIDE (POUR BTL) OPTIME
TOPICAL | Status: DC | PRN
  Administered 2014-10-22: 1000 mL

## 2014-10-22 MED ORDER — PHENYLEPHRINE 40 MCG/ML (10ML) SYRINGE FOR IV PUSH (FOR BLOOD PRESSURE SUPPORT)
PREFILLED_SYRINGE | INTRAVENOUS | Status: AC
  Filled 2014-10-22: qty 20

## 2014-10-22 MED ORDER — FENTANYL CITRATE (PF) 250 MCG/5ML IJ SOLN
INTRAMUSCULAR | Status: AC
  Filled 2014-10-22: qty 5

## 2014-10-22 MED ORDER — GLYCOPYRROLATE 0.2 MG/ML IJ SOLN
INTRAMUSCULAR | Status: AC
  Filled 2014-10-22: qty 8

## 2014-10-22 MED ORDER — LACTATED RINGERS IV SOLN
INTRAVENOUS | Status: DC
  Administered 2014-10-22 (×2): via INTRAVENOUS

## 2014-10-22 MED ORDER — LIDOCAINE-EPINEPHRINE 1 %-1:100000 IJ SOLN
INTRAMUSCULAR | Status: DC | PRN
  Administered 2014-10-22: 6 mL

## 2014-10-22 MED ORDER — EPHEDRINE SULFATE 50 MG/ML IJ SOLN
INTRAMUSCULAR | Status: AC
  Filled 2014-10-22: qty 2

## 2014-10-22 MED ORDER — ROCURONIUM BROMIDE 50 MG/5ML IV SOLN
INTRAVENOUS | Status: AC
  Filled 2014-10-22: qty 1

## 2014-10-22 MED ORDER — VANCOMYCIN HCL 1000 MG IV SOLR
INTRAVENOUS | Status: DC | PRN
Start: 2014-10-22 — End: 2014-10-22
  Administered 2014-10-22: 1000 mg

## 2014-10-22 MED ORDER — STERILE WATER FOR INJECTION IJ SOLN
INTRAMUSCULAR | Status: AC
  Filled 2014-10-22: qty 10

## 2014-10-22 MED ORDER — DEXAMETHASONE SODIUM PHOSPHATE 4 MG/ML IJ SOLN
INTRAMUSCULAR | Status: AC
  Filled 2014-10-22: qty 3

## 2014-10-22 MED ORDER — LIDOCAINE HCL (CARDIAC) 20 MG/ML IV SOLN
INTRAVENOUS | Status: DC | PRN
Start: 2014-10-22 — End: 2014-10-22
  Administered 2014-10-22: 40 mg via INTRAVENOUS

## 2014-10-22 MED ORDER — CEFAZOLIN SODIUM-DEXTROSE 2-3 GM-% IV SOLR
2.0000 g | INTRAVENOUS | Status: AC
  Administered 2014-10-22: 2 g via INTRAVENOUS
  Filled 2014-10-22: qty 50

## 2014-10-22 MED ORDER — PROPOFOL 10 MG/ML IV BOLUS
INTRAVENOUS | Status: AC
  Filled 2014-10-22: qty 20

## 2014-10-22 MED ORDER — NEOSTIGMINE METHYLSULFATE 10 MG/10ML IV SOLN
INTRAVENOUS | Status: AC
  Filled 2014-10-22: qty 2

## 2014-10-22 MED ORDER — BUPIVACAINE HCL (PF) 0.5 % IJ SOLN
INTRAMUSCULAR | Status: DC | PRN
  Administered 2014-10-22: 6 mL

## 2014-10-22 MED ORDER — SUCCINYLCHOLINE CHLORIDE 20 MG/ML IJ SOLN
INTRAMUSCULAR | Status: AC
  Filled 2014-10-22: qty 2

## 2014-10-22 MED ORDER — MIDAZOLAM HCL 2 MG/2ML IJ SOLN
INTRAMUSCULAR | Status: AC
  Filled 2014-10-22: qty 4

## 2014-10-22 MED ORDER — FENTANYL CITRATE (PF) 100 MCG/2ML IJ SOLN: 25.0000 ug | INTRAMUSCULAR | Status: DC | PRN

## 2014-10-22 MED ORDER — ARTIFICIAL TEARS OP OINT
TOPICAL_OINTMENT | OPHTHALMIC | Status: AC
  Filled 2014-10-22: qty 10.5

## 2014-10-22 MED ORDER — DEXAMETHASONE SODIUM PHOSPHATE 10 MG/ML IJ SOLN
INTRAMUSCULAR | Status: AC
  Filled 2014-10-22: qty 2

## 2014-10-22 MED ORDER — PHENYLEPHRINE HCL 10 MG/ML IJ SOLN
INTRAMUSCULAR | Status: DC | PRN
  Administered 2014-10-22 (×3): 80 ug via INTRAVENOUS

## 2014-10-22 MED ORDER — LIDOCAINE HCL (CARDIAC) 20 MG/ML IV SOLN
INTRAVENOUS | Status: AC
  Filled 2014-10-22: qty 5

## 2014-10-22 MED ORDER — ETOMIDATE 2 MG/ML IV SOLN
INTRAVENOUS | Status: AC
  Filled 2014-10-22: qty 10

## 2014-10-22 MED ORDER — VANCOMYCIN HCL 1000 MG IV SOLR
INTRAVENOUS | Status: AC
  Filled 2014-10-22: qty 1000

## 2014-10-22 MED ORDER — MUPIROCIN 2 % EX OINT
1.0000 "application " | TOPICAL_OINTMENT | Freq: Once | CUTANEOUS | Status: AC
  Administered 2014-10-22: 1 via TOPICAL
  Filled 2014-10-22: qty 22

## 2014-10-22 MED ORDER — FENTANYL CITRATE (PF) 100 MCG/2ML IJ SOLN
INTRAMUSCULAR | Status: DC | PRN
  Administered 2014-10-22: 50 ug via INTRAVENOUS

## 2014-10-22 MED ORDER — SODIUM CHLORIDE 0.9 % IJ SOLN
INTRAMUSCULAR | Status: AC
  Filled 2014-10-22: qty 10

## 2014-10-22 MED ORDER — ONDANSETRON HCL 4 MG/2ML IJ SOLN
INTRAMUSCULAR | Status: AC
  Filled 2014-10-22: qty 2

## 2014-10-22 MED ORDER — PROPOFOL 10 MG/ML IV BOLUS
INTRAVENOUS | Status: DC | PRN
  Administered 2014-10-22: 200 mg via INTRAVENOUS

## 2014-10-22 MED ORDER — METOCLOPRAMIDE HCL 5 MG/ML IJ SOLN: 10.0000 mg | Freq: Once | INTRAMUSCULAR | Status: DC | PRN

## 2014-10-22 MED ORDER — MIDAZOLAM HCL 5 MG/5ML IJ SOLN
INTRAMUSCULAR | Status: DC | PRN
  Administered 2014-10-22 (×2): 1 mg via INTRAVENOUS

## 2014-10-22 SURGICAL SUPPLY — 57 items
BENZOIN TINCTURE PRP APPL 2/3 (GAUZE/BANDAGES/DRESSINGS) IMPLANT
BLADE CLIPPER SURG (BLADE) IMPLANT
BRUSH SCRUB EZ PLAIN DRY (MISCELLANEOUS) ×3 IMPLANT
BUR MATCHSTICK NEURO 3.0 LAGG (BURR) IMPLANT
BUR PRECISION FLUTE 5.0 (BURR) IMPLANT
CHARGING SYSTEM ×3 IMPLANT
CLOSURE WOUND 1/2 X4 (GAUZE/BANDAGES/DRESSINGS)
DECANTER SPIKE VIAL GLASS SM (MISCELLANEOUS) IMPLANT
DERMABOND ADVANCED (GAUZE/BANDAGES/DRESSINGS) ×2
DERMABOND ADVANCED .7 DNX12 (GAUZE/BANDAGES/DRESSINGS) ×1 IMPLANT
DRAPE C-ARM 42X72 X-RAY (DRAPES) ×6 IMPLANT
DRAPE INCISE IOBAN 85X60 (DRAPES) IMPLANT
DRAPE LAPAROTOMY 100X72X124 (DRAPES) ×3 IMPLANT
DRAPE POUCH INSTRU U-SHP 10X18 (DRAPES) ×3 IMPLANT
DRAPE SURG 17X23 STRL (DRAPES) ×3 IMPLANT
DRSG OPSITE POSTOP 4X6 (GAUZE/BANDAGES/DRESSINGS) ×3 IMPLANT
ELECT REM PT RETURN 9FT ADLT (ELECTROSURGICAL) ×3
ELECTRODE REM PT RTRN 9FT ADLT (ELECTROSURGICAL) ×1 IMPLANT
GAUZE SPONGE 4X4 16PLY XRAY LF (GAUZE/BANDAGES/DRESSINGS) IMPLANT
GLOVE BIO SURGEON STRL SZ8 (GLOVE) ×3 IMPLANT
GLOVE BIOGEL PI IND STRL 8 (GLOVE) IMPLANT
GLOVE BIOGEL PI IND STRL 8.5 (GLOVE) ×1 IMPLANT
GLOVE BIOGEL PI INDICATOR 8 (GLOVE)
GLOVE BIOGEL PI INDICATOR 8.5 (GLOVE) ×2
GLOVE ECLIPSE 7.5 STRL STRAW (GLOVE) IMPLANT
GLOVE EXAM NITRILE LRG STRL (GLOVE) IMPLANT
GLOVE EXAM NITRILE MD LF STRL (GLOVE) ×3 IMPLANT
GLOVE EXAM NITRILE XL STR (GLOVE) IMPLANT
GLOVE EXAM NITRILE XS STR PU (GLOVE) IMPLANT
GOWN STRL REUS W/ TWL LRG LVL3 (GOWN DISPOSABLE) ×1 IMPLANT
GOWN STRL REUS W/ TWL XL LVL3 (GOWN DISPOSABLE) ×1 IMPLANT
GOWN STRL REUS W/TWL 2XL LVL3 (GOWN DISPOSABLE) IMPLANT
GOWN STRL REUS W/TWL LRG LVL3 (GOWN DISPOSABLE) ×2
GOWN STRL REUS W/TWL XL LVL3 (GOWN DISPOSABLE) ×2
KIT BASIN OR (CUSTOM PROCEDURE TRAY) ×3 IMPLANT
KIT ROOM TURNOVER OR (KITS) ×3 IMPLANT
NEEDLE HYPO 25X1 1.5 SAFETY (NEEDLE) ×3 IMPLANT
NS IRRIG 1000ML POUR BTL (IV SOLUTION) ×3 IMPLANT
PACK LAMINECTOMY NEURO (CUSTOM PROCEDURE TRAY) ×3 IMPLANT
PAD ARMBOARD 7.5X6 YLW CONV (MISCELLANEOUS) ×9 IMPLANT
PROGRAMMER PATIENT (MISCELLANEOUS) ×3 IMPLANT
SPONGE LAP 4X18 X RAY DECT (DISPOSABLE) IMPLANT
SPONGE SURGIFOAM ABS GEL SZ50 (HEMOSTASIS) IMPLANT
STAPLER SKIN PROX WIDE 3.9 (STAPLE) IMPLANT
STIMULATOR SURESCAN SPINAL (Neurostimulator) ×3 IMPLANT
STRIP CLOSURE SKIN 1/2X4 (GAUZE/BANDAGES/DRESSINGS) IMPLANT
SUT SILK 0 TIES 10X30 (SUTURE) IMPLANT
SUT SILK 2 0 FS (SUTURE) IMPLANT
SUT SILK 2 0 TIES 10X30 (SUTURE) IMPLANT
SUT VIC AB 0 CT1 18XCR BRD8 (SUTURE) IMPLANT
SUT VIC AB 0 CT1 8-18 (SUTURE)
SUT VIC AB 2-0 CP2 18 (SUTURE) ×3 IMPLANT
SUT VIC AB 3-0 SH 8-18 (SUTURE) ×3 IMPLANT
SYSTEM RECHARG SURESCAN SPINE (Neurostimulator) ×2 IMPLANT
TOWEL OR 17X24 6PK STRL BLUE (TOWEL DISPOSABLE) ×3 IMPLANT
TOWEL OR 17X26 10 PK STRL BLUE (TOWEL DISPOSABLE) ×3 IMPLANT
WATER STERILE IRR 1000ML POUR (IV SOLUTION) IMPLANT

## 2014-10-22 NOTE — Progress Notes (Signed)
[facility] non-emergent transport called to take patient back to [facility] in [place], [place]. Daughter and [employer] rep for spinal stimulator at bedside, pt educated on use of stimulator. [facility] will take patient back to facility upon discharge. Report called to Elvera Maria, RN at facility.

## 2014-10-22 NOTE — Transfer of Care (Signed)
Immediate Anesthesia Transfer of Care Note  Patient: Evelyn Washington  Procedure(s) Performed: Procedure(s) with comments: Implantable pulse generator battery change (N/A) - Implantable pulse generator battery change  Patient Location: PACU  Anesthesia Type:General  Level of Consciousness: awake  Airway & Oxygen Therapy: Patient Spontanous Breathing and Patient connected to nasal cannula oxygen  Post-op Assessment: Report given to RN and Post -op Vital signs reviewed and stable  Post vital signs: Reviewed and stable  Last Vitals:  Filed Vitals:   10/22/14 1030  BP: 149/41  Pulse: 64  Temp: 36.8 C  Resp: 20    Complications: No apparent anesthesia complications

## 2014-10-22 NOTE — H&P (Signed)
Patient ID:   [id number] Patient: Evelyn Washington  Date of Birth: [birth year] Visit Type: Office Visit   Date: 10/14/2014 12:45 PM Provider: Marchia Meiers. Vertell Limber MD   This 74 year old female presents for back pain and Leg pain.  History of Present Illness: 1.  back pain  2.  Leg pain  Patient has found her stimulator to be extremely helpful, but because she is running it at extremely high settings, the IPG is now depleted.  After discussion with [employer] Representative, plan is to replace IPG with rechargeable unit.  She is relocating to [facility] in the near future, so plan is to perform IPG revision ASAP prior to her planned relocation, which also makes sense, given that the unit is now depleted.  Impedences were checked today and are found to be in acceptable range.  Patient previously had yeast infection in her skin overlying previous IPG, but this looks good today and her sutperficial skin infection is resolved.  Patient wishes to proceed with IPG revision ASAP.      Medical/Surgical/Interim History Reviewed, no change.  Last detailed document date:04/08/2014.   PAST MEDICAL HISTORY, SURGICAL HISTORY, FAMILY HISTORY, SOCIAL HISTORY AND REVIEW OF SYSTEMS I have reviewed the patient's past medical, surgical, family and social history as well as the comprehensive review of systems as included on the [facility] history form dated 04/08/2014, which I have signed.  Family History: Reviewed, no changes.  Last detailed document: 04/08/2014.   Social History: Tobacco use reviewed. Reviewed, no changes. Last detailed document date: 04/08/2014.      MEDICATIONS(added, continued or stopped this visit): Started Medication Directions Instruction Stopped   Abilify 2 mg tablet Take 1 tablet daily     Amaryl 2 mg tablet take 1 tablet by oral route  every day     Colace 100 mg capsule take 1 capsule by oral route  every day at bedtime as needed     Coumadin  6 mg tablet take 1 tablet by oral route  every day     Deep Sea Nasal 0.65 % spray aerosol use as directed     dorzolamide 2 % eye drops instill 1 drop by ophthalmic route 3 times every day into affected eye(s)     gabapentin 300 mg capsule take 1 capsule by oral route 3 times every day     lamotrigine 100 mg tablet take 1 tablet by oral route  every day     Lasix 40 mg tablet take 1 tablet by oral route  every day     Levemir 100 unit/mL subcutaneous solution inject 48 units by subcutaneous route per prescriber's instructions. Insulin dosing requires individualization.     Lexapro 10 mg tablet take 1 tablet by oral route  every day     lisinopril 5 mg tablet take 1 tablet by oral route  every day     metformin 1,000 mg tablet take 1 tablet by oral route 2 times every day with morning and evening meals     Percocet 5 mg-325 mg tablet take 1 tablet by oral route 3 times every day as needed     propranolol 40 mg tablet take 1 tablet by oral route 2 times every day     Risperdal 1 mg tablet take 1 tablet by oral route  every day     Robaxin-750 750 mg tablet take 1 tablet by oral route  every 6 hours     Tylenol 325 mg tablet  take 1 tablet by oral route  every 4 hours as needed     Ultram 50 mg tablet take 1 tablet by oral route  every 6 hours as needed     Vitamin D2 50,000 unit capsule take 1 capsule by oral route  every week     Xanax 0.25 mg tablet take 1 tablet by oral route   3 times a day     Zocor 20 mg tablet take 1 tablet by oral route  every day in the evening       ALLERGIES: Ingredient Reaction Medication Name Comment  PENTAZOCINE     OXYCODONE HCL  OxyContin   HALOPERIDOL  Haldol   HALOPERIDOL LACTATE  Haldol   ONDANSETRON HCL Seizures Zofran (as hydrochloride)    Reviewed, updated.    Vitals Date Temp F BP Pulse Ht In Wt Lb BMI BSA Pain Score  10/14/2014  100/88 76 59.5 234 46.47  5/10      IMPRESSION Depleted IPG for spinal cord stimulator  Completed Orders  (this encounter) Order Details Reason Side Interpretation Result Initial Treatment Date Region  Dietary management education, guidance, and counseling Encouraged to eat a well balanced diet and follow up with primary care physician.         Assessment/Plan # Detail Type Description   1. Assessment Spinal cord stimulator status (Z96.89).       2. 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       3. Assessment Scoliosis (and kyphoscoliosis), idiopathic (M41.20).       4. Assessment Low back pain, unspecified back pain laterality, with sciatica presence unspecified (M54.5).       5. Assessment Body mass index (BMI) 45.0-49.9, adult (Z68.42).   Plan Orders Today's instructions / counseling include(s) Dietary management education, guidance, and counseling.         Pain Assessment/Treatment Pain Scale: 5/10. Method: Numeric Pain Intensity Scale. Location: back. Onset: 01/06/2014. Duration: varies. Quality: discomforting. Pain Assessment/Treatment follow-up plan of care: Patient taking medications as prescribed..  Fall Risk Plan The patient has not fallen in the last year.  IPG revision with rechargeable unit.  Orders: Diagnostic Procedures: Assessment Procedure  Z96.89 Change IPG battery  Instruction(s)/Education: Assessment Instruction  [phone] Dietary management education, guidance, and counseling             Provider:  Marchia Meiers. Vertell Limber MD  10/17/2014 09:03 AM Dictation edited by: Marchia Meiers. Vertell Limber    CC Providers: Erline Levine MD [address] [place], [place] [id number]              Electronically signed by Marchia Meiers. Vertell Limber MD on 10/17/2014 09:03 AM

## 2014-10-22 NOTE — Anesthesia Procedure Notes (Signed)
Procedure Name: LMA Insertion Date/Time: 10/22/2014 3:44 PM Performed by: Clearnce Sorrel Pre-anesthesia Checklist: Patient identified, Timeout performed, Emergency Drugs available, Suction available and Patient being monitored Patient Re-evaluated:Patient Re-evaluated prior to inductionOxygen Delivery Method: Circle system utilized Preoxygenation: Pre-oxygenation with 100% oxygen Intubation Type: IV induction LMA: LMA inserted LMA Size: 4.0 Number of attempts: 1 Placement Confirmation: positive ETCO2 and breath sounds checked- equal and bilateral Tube secured with: Tape Dental Injury: Teeth and Oropharynx as per pre-operative assessment

## 2014-10-22 NOTE — Op Note (Signed)
10/22/2014  4:17 PM  PATIENT:  Evelyn Washington  74 y.o. female  PRE-OPERATIVE DIAGNOSIS:  Spinal cord stimulator  With depleted IPG  POST-OPERATIVE DIAGNOSIS: Spinal cord stimulator  With depleted IPG  PROCEDURE:  Procedure(s) with comments: Implantable pulse generator battery change (N/A) - Implantable pulse generator battery change  SURGEON:  Surgeon(s) and Role:    * Erline Levine, MD - Primary  PHYSICIAN ASSISTANT:   ASSISTANTS: none   ANESTHESIA:   general  EBL:     BLOOD ADMINISTERED:none  DRAINS: none   LOCAL MEDICATIONS USED:  LIDOCAINE   SPECIMEN:  No Specimen  DISPOSITION OF SPECIMEN:  N/A  COUNTS:  YES  TOURNIQUET:  * No tourniquets in log *  DICTATION: DICTATION: Patient has implanted spinal cord stimulator and IPG, which is now depleted.  It was elected for patient to undergo IPG revision.  PROCEDURE: Patient was brought to the operating room and given intravenous sedation with LMA.  Left upper abdomen was prepped with betadine scrub and paint.  Area of planned incision was infiltrated with lidocaine.  Prior incision was reopened and the old IPG was externalized.  Adaptor was connected to new IPG which was placed in the pocket.  Wound was irrigated with vancomycin. Then irrigated once more.  Incision was closed with 2-0 Vicryl and 3-0 vicryl sutures and dressed with a sterile occlusive dressing.  Counts were correct at the end of the case.  PLAN OF CARE: Discharge to home after PACU  PATIENT DISPOSITION:  PACU - hemodynamically stable.   Delay start of Pharmacological VTE agent (>24hrs) due to surgical blood loss or risk of bleeding: yes

## 2014-10-22 NOTE — Brief Op Note (Signed)
10/22/2014  4:17 PM  PATIENT:  Evelyn Washington  74 y.o. female  PRE-OPERATIVE DIAGNOSIS:  Spinal cord stimulator  With depleted IPG  POST-OPERATIVE DIAGNOSIS: Spinal cord stimulator  With depleted IPG  PROCEDURE:  Procedure(s) with comments: Implantable pulse generator battery change (N/A) - Implantable pulse generator battery change  SURGEON:  Surgeon(s) and Role:    * Erline Levine, MD - Primary  PHYSICIAN ASSISTANT:   ASSISTANTS: none   ANESTHESIA:   general  EBL:     BLOOD ADMINISTERED:none  DRAINS: none   LOCAL MEDICATIONS USED:  LIDOCAINE   SPECIMEN:  No Specimen  DISPOSITION OF SPECIMEN:  N/A  COUNTS:  YES  TOURNIQUET:  * No tourniquets in log *  DICTATION: DICTATION: Patient has implanted spinal cord stimulator and IPG, which is now depleted.  It was elected for patient to undergo IPG revision.  PROCEDURE: Patient was brought to the operating room and given intravenous sedation with LMA.  Left upper abdomen was prepped with betadine scrub and paint.  Area of planned incision was infiltrated with lidocaine.  Prior incision was reopened and the old IPG was externalized.  Adaptor was connected to new IPG which was placed in the pocket.  Wound was irrigated with vancomycin. Then irrigated once more.  Incision was closed with 2-0 Vicryl and 3-0 vicryl sutures and dressed with a sterile occlusive dressing.  Counts were correct at the end of the case.  PLAN OF CARE: Discharge to home after PACU  PATIENT DISPOSITION:  PACU - hemodynamically stable.   Delay start of Pharmacological VTE agent (>24hrs) due to surgical blood loss or risk of bleeding: yes

## 2014-10-22 NOTE — Discharge Instructions (Signed)
Bandage off in 3-5 days, dpnt get bandage wet  Make appt for 15th [phone] (DR stern)  Lift nothing heavier than a gallon of milk til sees MD  Call for excessive redness, fever over 101,or drainage from site

## 2014-10-22 NOTE — Interval H&P Note (Signed)
History and Physical Interval Note:  10/22/2014 7:16 AM  Evelyn Washington  has presented today for surgery, with the diagnosis of Spinal cord stimulator status  The various methods of treatment have been discussed with the patient and family. After consideration of risks, benefits and other options for treatment, the patient has consented to  Procedure(s) with comments: Implantable pulse generator battery change (N/A) - Implantable pulse generator battery change as a surgical intervention .  The patient's history has been reviewed, patient examined, no change in status, stable for surgery.  I have reviewed the patient's chart and labs.  Questions were answered to the patient's satisfaction.     Tamala Manzer D

## 2014-10-23 ENCOUNTER — Encounter (HOSPITAL_COMMUNITY): Payer: Self-pay | Admitting: Neurosurgery

## 2014-10-23 NOTE — Anesthesia Postprocedure Evaluation (Signed)
Anesthesia Post Note  Patient: Evelyn Washington  Procedure(s) Performed: Procedure(s) (LRB): Implantable pulse generator battery change (N/A)  Anesthesia type: General  Patient location: PACU  Post pain: Pain level controlled  Post assessment: Post-op Vital signs reviewed  Last Vitals: BP 137/71 mmHg  Pulse 70  Temp(Src) 36.7 C (Oral)  Resp 13  Ht 4\' 11"  (1.499 m)  Wt 229 lb (103.874 kg)  BMI 46.23 kg/m2  SpO2 100%  Post vital signs: Reviewed  Level of consciousness: sedated  Complications: No apparent anesthesia complications

## 2014-10-23 NOTE — Discharge Summary (Signed)
Physician Discharge Summary  Patient ID: Evelyn Washington MRN: [id number] DOB/AGE: April 03, 1940 74 y.o.  Admit date: 10/22/2014 Discharge date: 10/23/2014  Admission Diagnoses:Chronic back pain with spinal cord stimulator with depleted IPG  Discharge Diagnoses: Same Active Problems:   * No active hospital problems. *   Discharged Condition: good  Hospital Course: Patient underwent revision of SCS battery  Consults: None  Significant Diagnostic Studies: None  Treatments: surgery: revision of SCS battery  Discharge Exam: Blood pressure 137/71, pulse 70, temperature 98 F (36.7 C), temperature source Oral, resp. rate 13, height 4\' 11"  (1.499 m), weight 103.874 kg (229 lb), SpO2 100 %. Neurologic: Grossly normal Wound:CDI  Disposition: Home     Medication List    ASK your doctor about these medications        acetaminophen 325 MG tablet  Commonly known as:  TYLENOL  Take 650 mg by mouth every 4 (four) hours as needed for mild pain.     ALPRAZolam 0.25 MG tablet  Commonly known as:  XANAX  Take 0.25 mg by mouth 3 (three) times daily as needed for anxiety.     ARIPiprazole 2 MG tablet  Commonly known as:  ABILIFY  Take 2 mg by mouth daily.     Choline Fenofibrate 45 MG capsule  Take 45 mg by mouth at bedtime.     citalopram 20 MG tablet  Commonly known as:  CELEXA  Take 1 tablet (20 mg total) by mouth daily.     docusate sodium 100 MG capsule  Commonly known as:  COLACE  Take 1,014 mg by mouth 2 (two) times daily as needed for mild constipation.     dorzolamide 2 % ophthalmic solution  Commonly known as:  TRUSOPT  Place 1 drop into both eyes 3 (three) times daily.     enoxaparin 30 MG/0.3ML injection  Commonly known as:  LOVENOX  Inject 0.3 mLs (30 mg total) into the skin every 12 (twelve) hours. Continue lovenox until INR therapeutic     escitalopram 10 MG tablet  Commonly known as:  LEXAPRO  Take 10 mg by mouth daily.     furosemide 40 MG tablet   Commonly known as:  LASIX  Take 40 mg by mouth daily.     gabapentin 600 MG tablet  Commonly known as:  NEURONTIN  Take 0.5 tablets (300 mg total) by mouth 4 (four) times daily - after meals and at bedtime.     gabapentin 300 MG capsule  Commonly known as:  NEURONTIN  Take 300 mg by mouth 3 (three) times daily.     glimepiride 2 MG tablet  Commonly known as:  AMARYL  Take 2 mg by mouth daily with breakfast.     HYDROcodone-acetaminophen 5-325 MG per tablet  Commonly known as:  NORCO/VICODIN  Take 1 tablet by mouth every 8 (eight) hours as needed.     insulin detemir 100 UNIT/ML injection  Commonly known as:  LEVEMIR  Inject 52 Units into the skin daily.     lamoTRIgine 100 MG tablet  Commonly known as:  LAMICTAL  Take 100 mg by mouth every morning.     lamoTRIgine 150 MG tablet  Commonly known as:  LAMICTAL  Take 150 mg by mouth at bedtime.     lisinopril 5 MG tablet  Commonly known as:  PRINIVIL,ZESTRIL  Take 5 mg by mouth daily.     metFORMIN 1000 MG tablet  Commonly known as:  GLUCOPHAGE  Take 1,000 mg by mouth 2 (  two) times daily with a meal.     methocarbamol 500 MG tablet  Commonly known as:  ROBAXIN  Take 1.5 tablets (750 mg total) by mouth 3 (three) times daily as needed.     oxyCODONE-acetaminophen 5-325 MG per tablet  Commonly known as:  PERCOCET/ROXICET  Take 0.5 tablets by mouth 3 (three) times daily.     promethazine 25 MG tablet  Commonly known as:  PHENERGAN  Take 25 mg by mouth every 6 (six) hours as needed for nausea or vomiting.     propranolol 40 MG tablet  Commonly known as:  INDERAL  Take 80 mg by mouth 2 (two) times daily.     risperiDONE 1 MG tablet  Commonly known as:  RISPERDAL  Take 1 mg by mouth 2 (two) times daily.     risperiDONE 1 MG disintegrating tablet  Commonly known as:  RISPERDAL M-TABS  Take 1 mg by mouth at bedtime.     simvastatin 40 MG tablet  Commonly known as:  ZOCOR  Take 20 mg by mouth every evening.      sodium chloride 0.65 % Soln nasal spray  Commonly known as:  OCEAN  Place 1 spray into both nostrils 3 (three) times daily as needed for congestion.     traMADol 50 MG tablet  Commonly known as:  ULTRAM  Take 50 mg by mouth every 6 (six) hours as needed for moderate pain.     Vitamin D (Ergocalciferol) 50000 UNITS Caps capsule  Commonly known as:  DRISDOL  Take 50,000 Units by mouth every 30 (thirty) days. Take on the 27 th of every month     warfarin 6 MG tablet  Commonly known as:  COUMADIN  Take 6 mg by mouth daily.     warfarin 1 MG tablet  Commonly known as:  COUMADIN  Take 0.5 mg by mouth daily. Take along with 6 mg for a total of 6.5 mg         Signed: Peggyann Shoals, MD 10/23/2014, 8:24 AM

## 2015-04-01 ENCOUNTER — Encounter: Payer: Self-pay | Admitting: *Deleted

## 2015-04-02 ENCOUNTER — Encounter: Payer: Self-pay | Admitting: Anesthesiology

## 2015-04-08 ENCOUNTER — Ambulatory Visit: Admission: RE | Admit: 2015-04-08 | Payer: Medicare Other | Source: Ambulatory Visit | Admitting: Ophthalmology

## 2015-04-08 HISTORY — DX: Presence of dental prosthetic device (complete) (partial): Z97.2

## 2015-04-08 SURGERY — REPAIR, BLEPHAROPTOSIS
Anesthesia: IV Sedation (MBSC Only) | Laterality: Bilateral

## 2015-04-30 IMAGING — CR DG SHOULDER 3+V*L*
1 series · 3 of 3 positions shown · non-contrast
Comparison: 05/26/2013

CLINICAL DATA: Fall.  Left shoulder pain.

EXAM:
DG SHOULDER 3+VIEWS LEFT

[Series 1: t shoulder grashey left · 0.14mm/px · 3 of 3 slices shown]
[im 1/3]
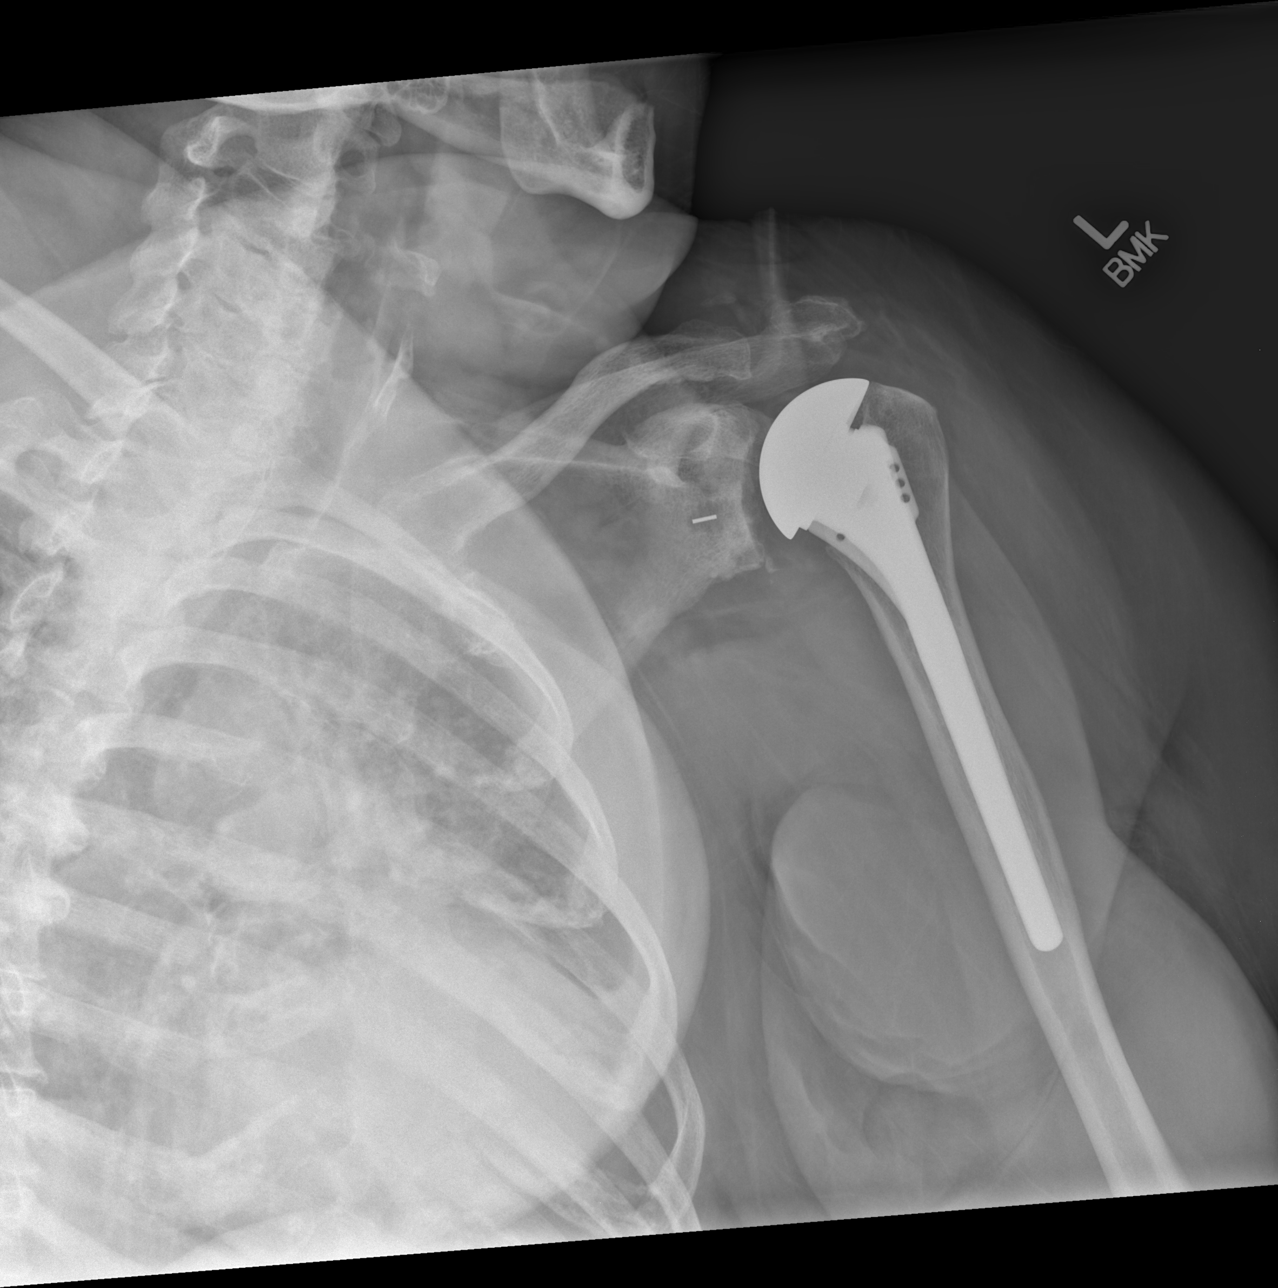
[im 2/3]
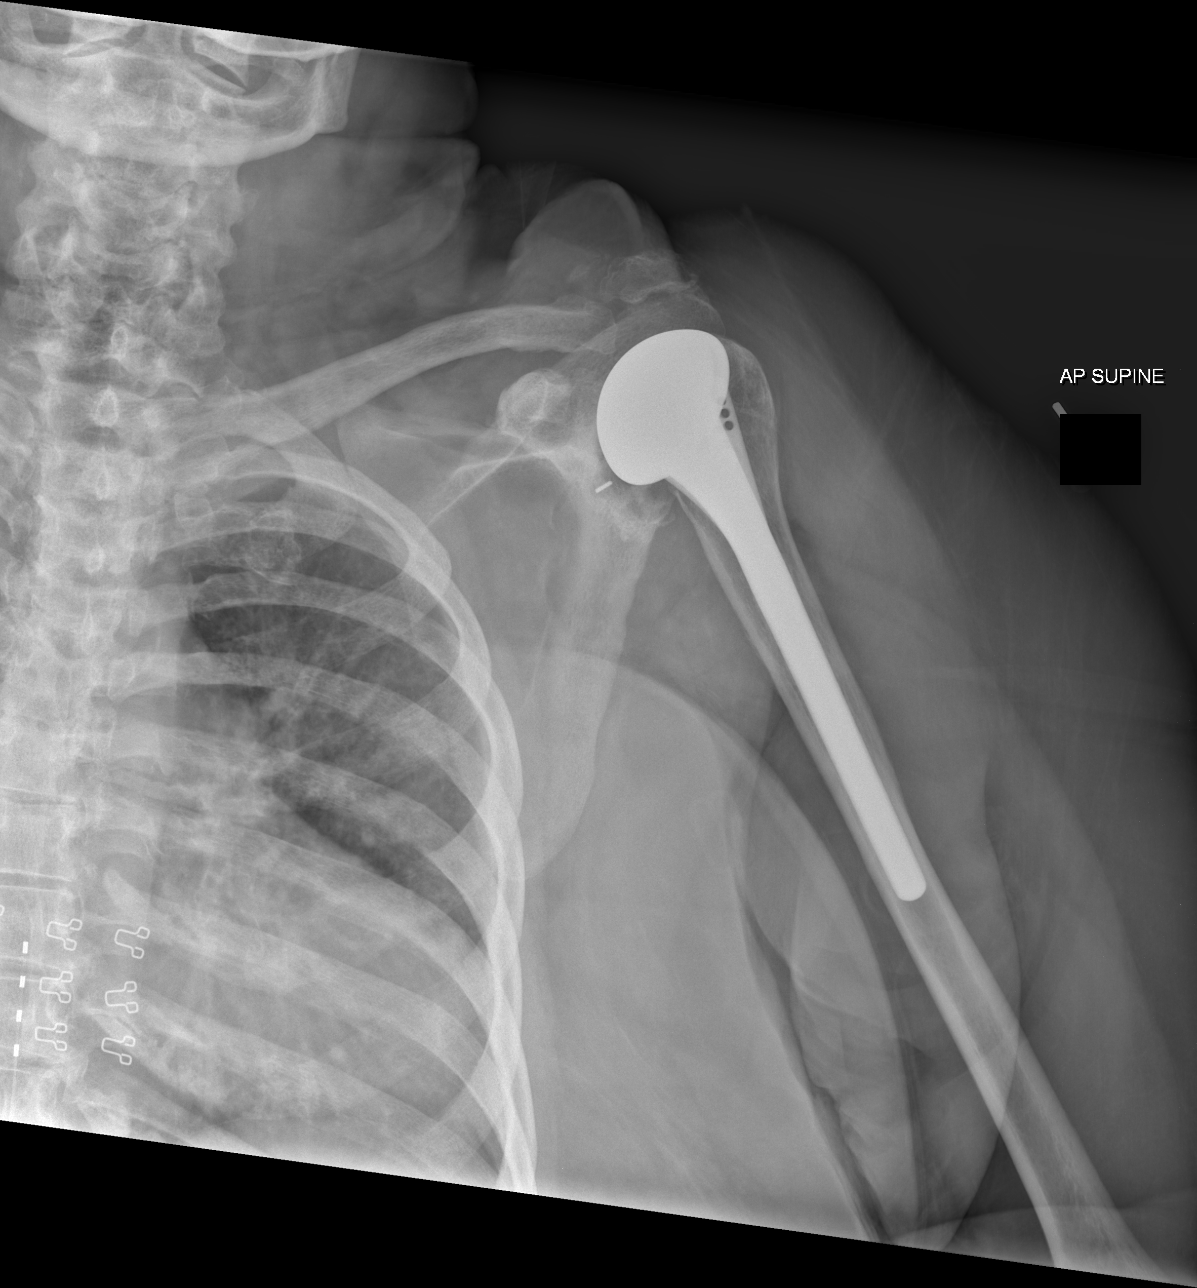
[im 3/3]
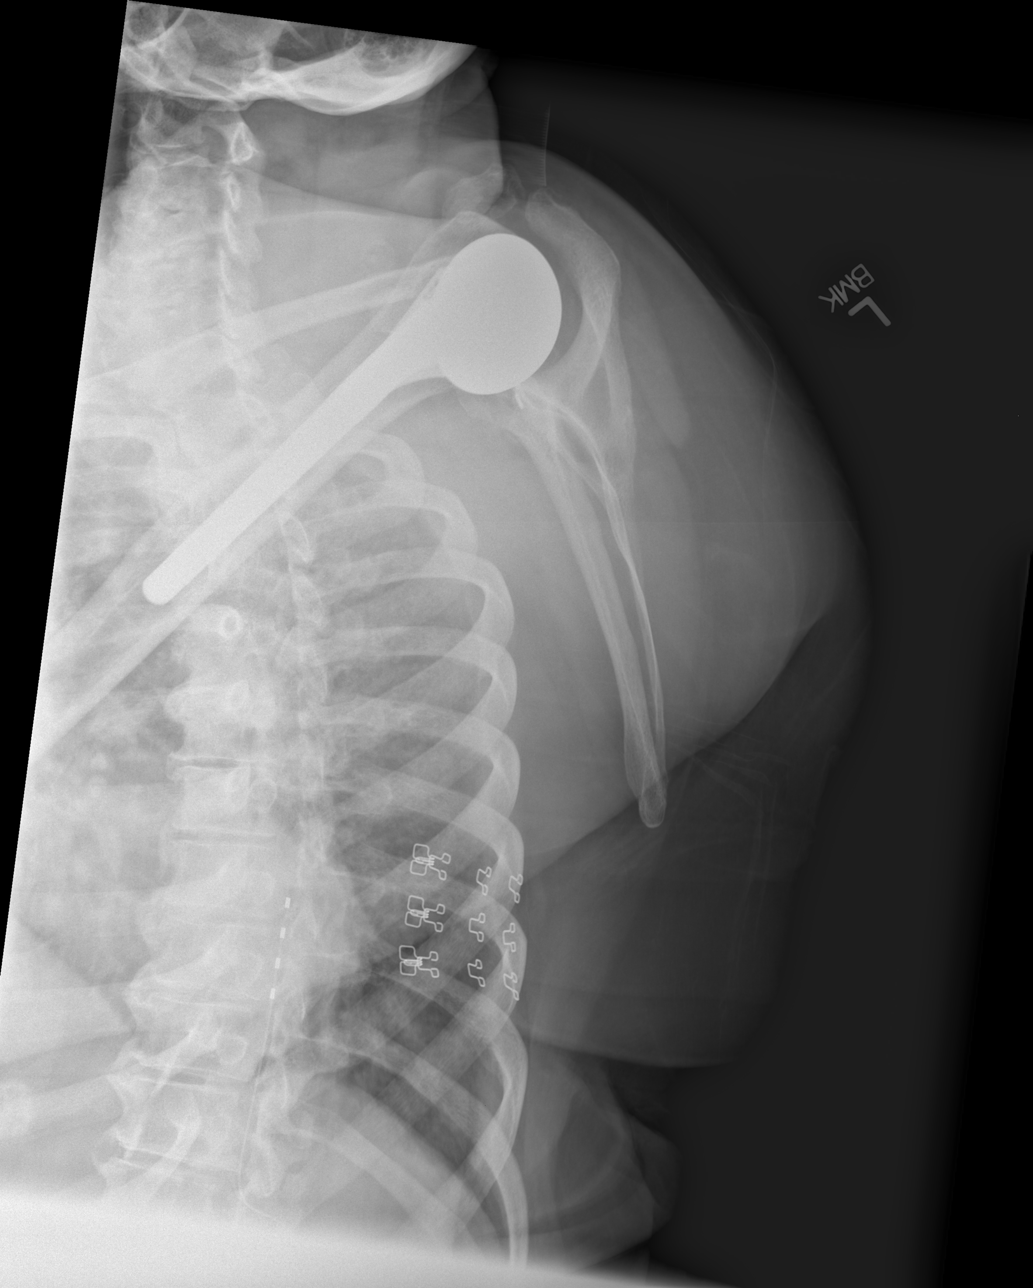

[3 of 3 positions shown; findings below may reference images not displayed]

FINDINGS: Changes of left shoulder replacement. No fracture. There is mild
widening of the left AC joint. Soft tissues are intact.
IMPRESSION: Mild widening of the left AC joint. Cannot exclude AC joint
separation.

Prior left shoulder replacement.

## 2015-04-30 IMAGING — CR DG FEMUR 2V*L*
1 series · 4 of 4 positions shown · non-contrast
Comparison: 02/05/2013

CLINICAL DATA: Fall, left leg pain.

EXAM:
LEFT FEMUR - 2 VIEW

[Series 1: t femur proximal ap left · 0.14mm/px · 4 of 4 slices shown]
[im 1/4]
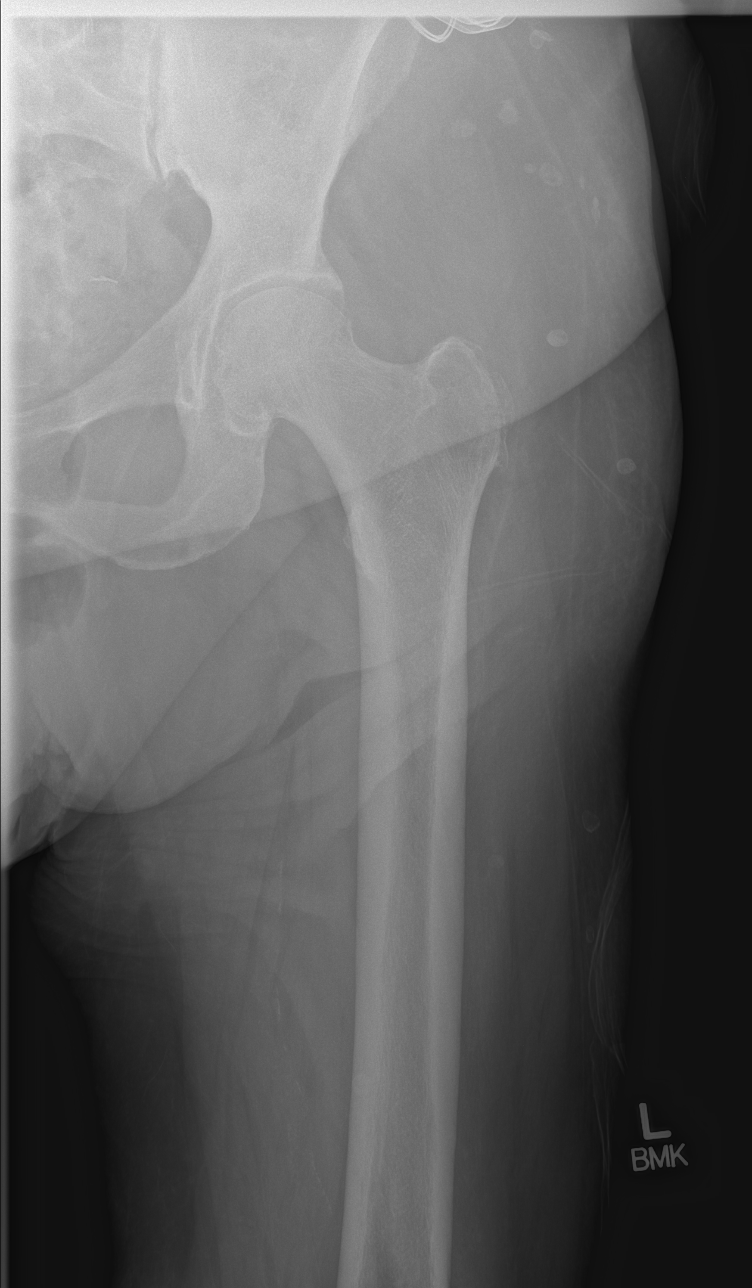
[im 2/4]
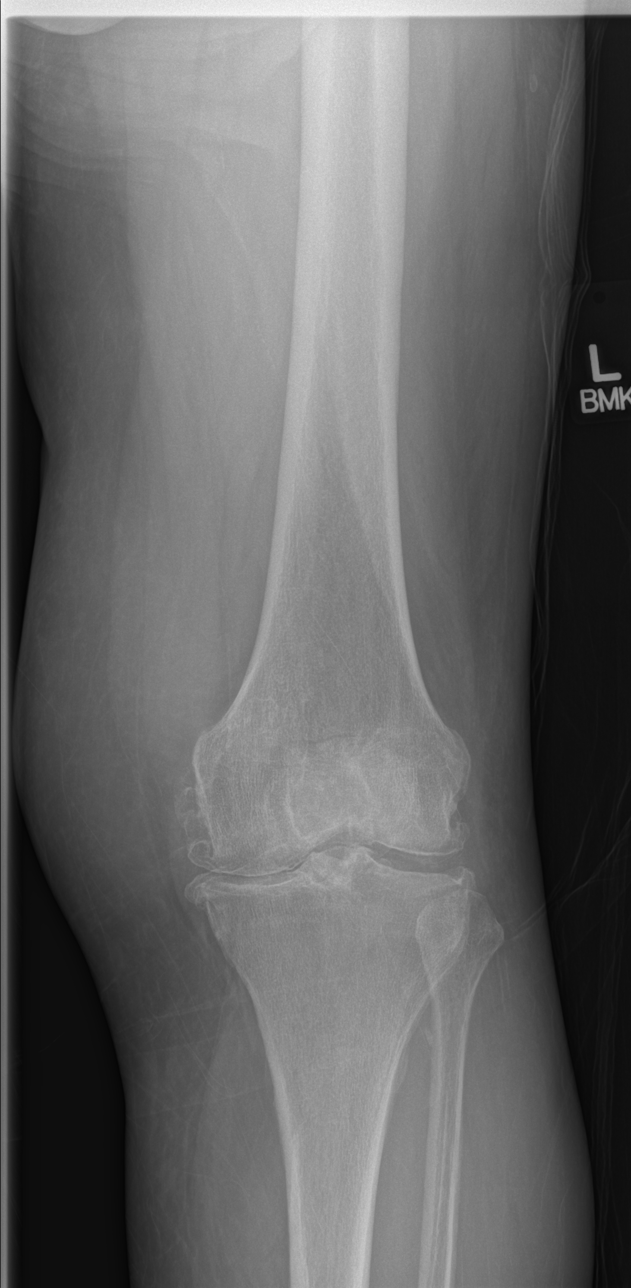
[im 3/4]
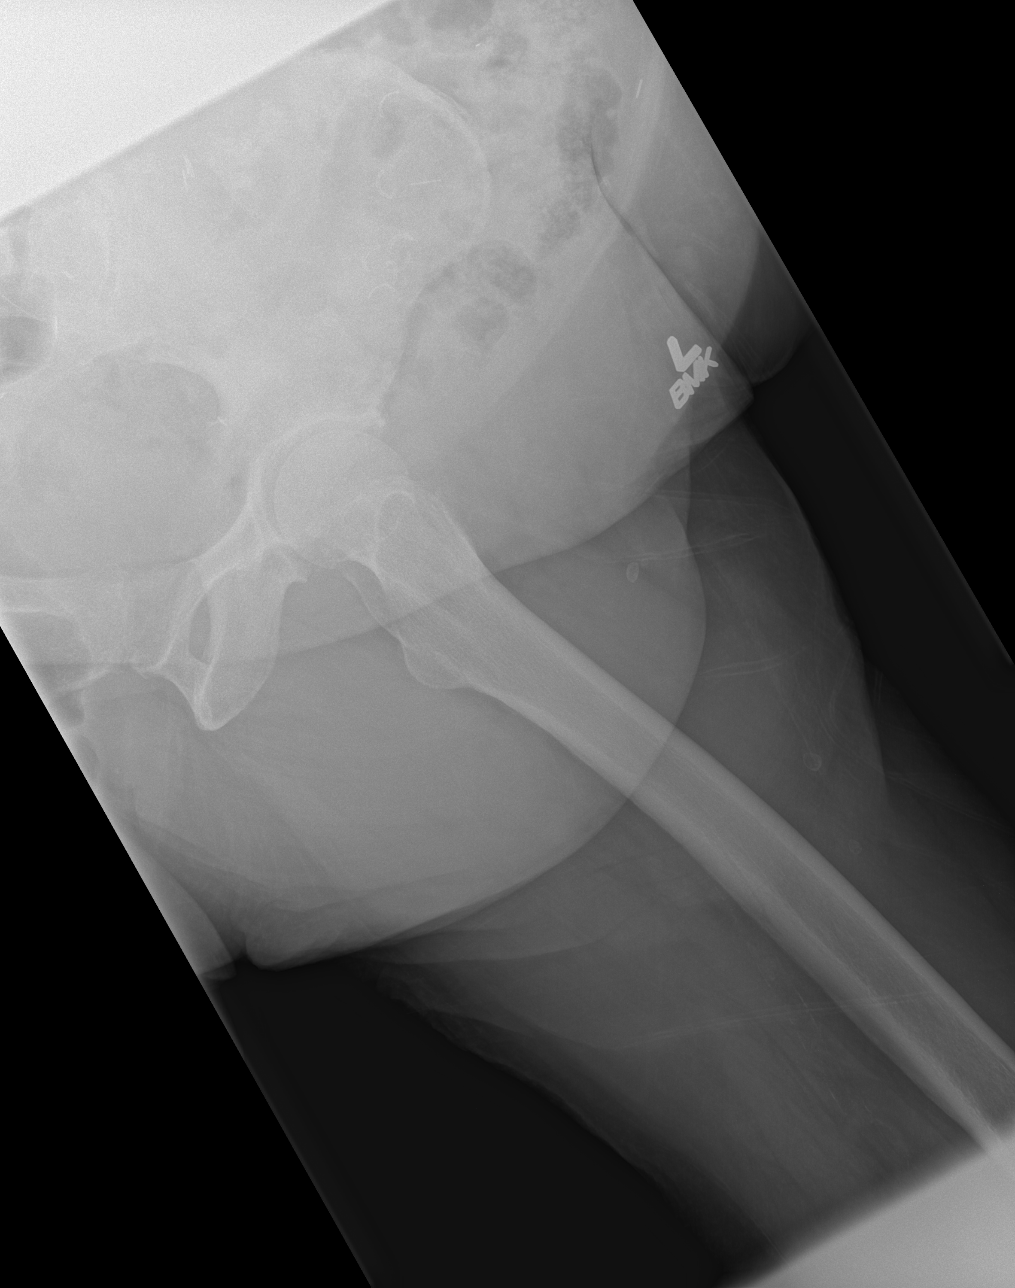
[im 4/4]
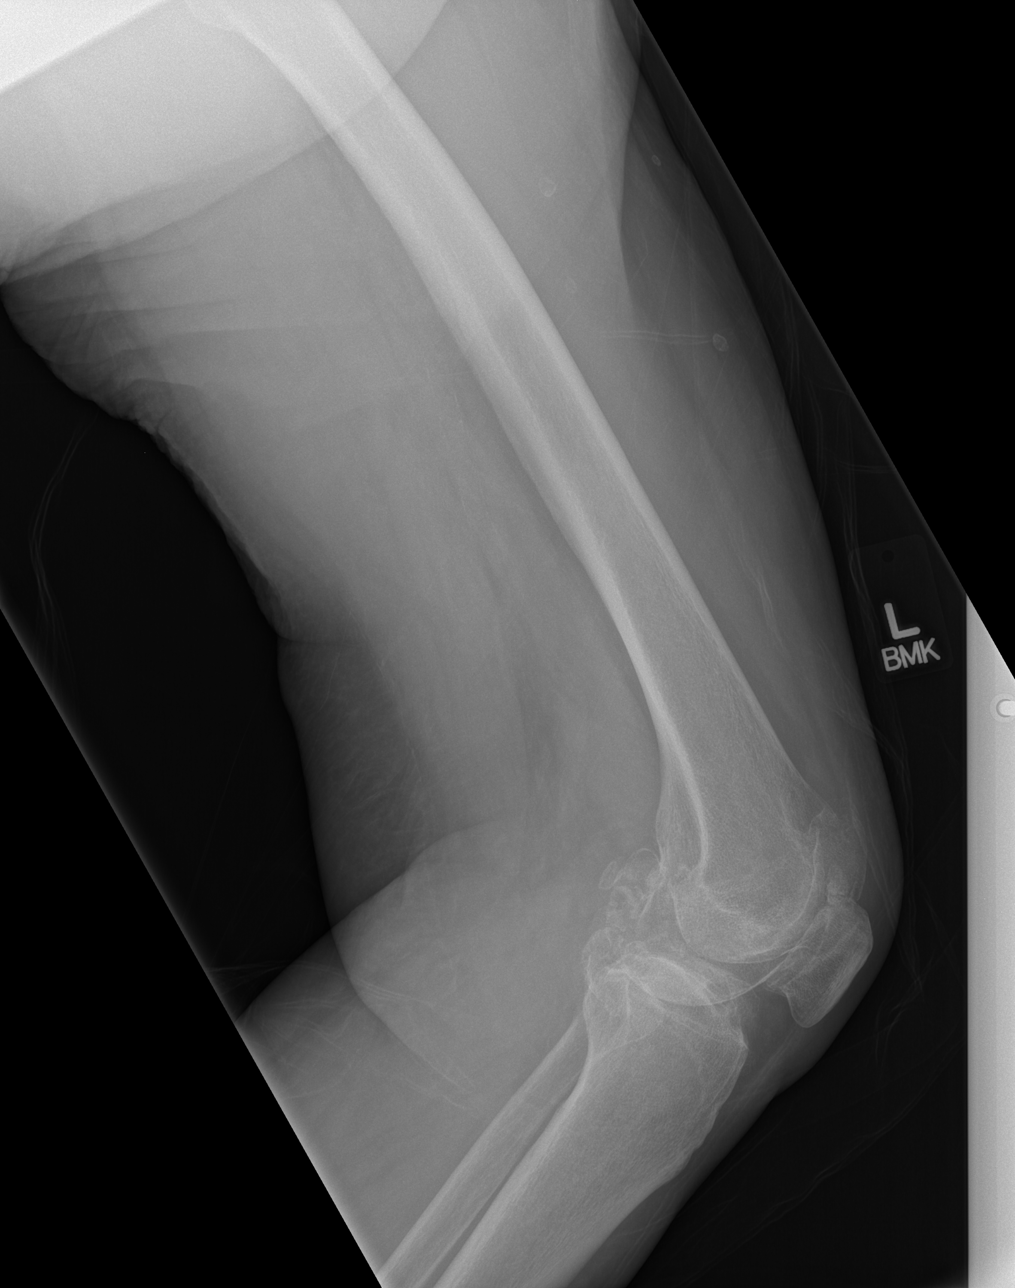

[4 of 4 positions shown; findings below may reference images not displayed]

FINDINGS: Advanced degenerative changes in the left knee involving all 3
compartments. No joint effusion. Mild degenerative changes in the
left hip. No fracture, subluxation or dislocation.
IMPRESSION: No acute bony abnormality.

## 2015-06-20 ENCOUNTER — Other Ambulatory Visit: Payer: Self-pay | Admitting: Internal Medicine

## 2015-06-20 DIAGNOSIS — Z1231 Encounter for screening mammogram for malignant neoplasm of breast: Secondary | ICD-10-CM

## 2015-06-27 ENCOUNTER — Ambulatory Visit
Admission: RE | Admit: 2015-06-27 | Discharge: 2015-06-27 | Disposition: A | Discharge status: Home / Self Care | Payer: Medicare Other | Source: Ambulatory Visit | Attending: Internal Medicine | Admitting: Internal Medicine

## 2015-06-27 DIAGNOSIS — Z1231 Encounter for screening mammogram for malignant neoplasm of breast: Secondary | ICD-10-CM | POA: Insufficient documentation

## 2015-07-16 ENCOUNTER — Other Ambulatory Visit: Payer: Self-pay | Admitting: Neurology

## 2015-07-16 DIAGNOSIS — G252 Other specified forms of tremor: Secondary | ICD-10-CM

## 2015-07-21 ENCOUNTER — Ambulatory Visit: Payer: Medicare Other

## 2015-07-25 ENCOUNTER — Ambulatory Visit
Admission: RE | Admit: 2015-07-25 | Discharge: 2015-07-25 | Disposition: A | Discharge status: Home / Self Care | Payer: Medicare Other | Source: Ambulatory Visit | Attending: Neurology | Admitting: Neurology

## 2015-07-25 DIAGNOSIS — R9082 White matter disease, unspecified: Secondary | ICD-10-CM | POA: Insufficient documentation

## 2015-07-25 DIAGNOSIS — G252 Other specified forms of tremor: Secondary | ICD-10-CM
